# Patient Record
Sex: Male | Born: 1940 | Race: White | Hispanic: No | Marital: Married | Smoking: Never smoker
Health system: Southern US, Community
[De-identification: ages and names within clinical notes are randomized; demographics above are authoritative.]

## PROBLEM LIST (undated history)

## (undated) DIAGNOSIS — N189 Chronic kidney disease, unspecified: Secondary | ICD-10-CM

## (undated) DIAGNOSIS — T7840XA Allergy, unspecified, initial encounter: Secondary | ICD-10-CM

## (undated) DIAGNOSIS — E785 Hyperlipidemia, unspecified: Secondary | ICD-10-CM

## (undated) DIAGNOSIS — C4490 Unspecified malignant neoplasm of skin, unspecified: Secondary | ICD-10-CM

## (undated) DIAGNOSIS — I10 Essential (primary) hypertension: Secondary | ICD-10-CM

## (undated) HISTORY — DX: Hyperlipidemia, unspecified: E78.5

## (undated) HISTORY — PX: MOHS SURGERY: SUR867

## (undated) HISTORY — PX: COLONOSCOPY: SHX174

## (undated) HISTORY — DX: Essential (primary) hypertension: I10

## (undated) HISTORY — DX: Unspecified malignant neoplasm of skin, unspecified: C44.90

## (undated) HISTORY — PX: LITHOTRIPSY: SUR834

## (undated) HISTORY — PX: OTHER SURGICAL HISTORY: SHX169

## (undated) HISTORY — DX: Allergy, unspecified, initial encounter: T78.40XA

---

## 1997-07-26 ENCOUNTER — Other Ambulatory Visit: Admission: RE | Admit: 1997-07-26 | Discharge: 1997-07-26 | Payer: Self-pay | Admitting: Urology

## 2004-01-13 ENCOUNTER — Ambulatory Visit: Payer: Self-pay | Admitting: Internal Medicine

## 2004-06-01 ENCOUNTER — Ambulatory Visit: Payer: Self-pay | Admitting: Internal Medicine

## 2004-08-05 ENCOUNTER — Ambulatory Visit: Payer: Self-pay | Admitting: Internal Medicine

## 2004-10-09 ENCOUNTER — Ambulatory Visit: Payer: Self-pay | Admitting: Internal Medicine

## 2005-02-01 ENCOUNTER — Ambulatory Visit: Payer: Self-pay | Admitting: Internal Medicine

## 2005-07-27 ENCOUNTER — Ambulatory Visit: Payer: Self-pay | Admitting: Internal Medicine

## 2005-08-13 ENCOUNTER — Ambulatory Visit: Payer: Self-pay | Admitting: Internal Medicine

## 2005-09-14 ENCOUNTER — Ambulatory Visit: Payer: Self-pay | Admitting: Internal Medicine

## 2005-12-15 ENCOUNTER — Ambulatory Visit: Payer: Self-pay | Admitting: Gastroenterology

## 2005-12-31 ENCOUNTER — Ambulatory Visit: Payer: Self-pay | Admitting: Gastroenterology

## 2005-12-31 LAB — HM COLONOSCOPY

## 2006-02-22 ENCOUNTER — Ambulatory Visit: Payer: Self-pay | Admitting: Internal Medicine

## 2006-02-22 LAB — CONVERTED CEMR LAB
Albumin: 3.9 g/dL (ref 3.5–5.2)
Chol/HDL Ratio, serum: 4.1
Creatinine,U: 171.4 mg/dL
Hgb A1c MFr Bld: 5.5 % (ref 4.6–6.0)
Microalb, Ur: 0.7 mg/dL (ref 0.0–1.9)
Uric Acid, Serum: 6.6 mg/dL (ref 2.4–7.0)
VLDL: 14 mg/dL (ref 0–40)

## 2007-06-28 ENCOUNTER — Ambulatory Visit: Payer: Self-pay | Admitting: Internal Medicine

## 2007-06-28 DIAGNOSIS — Z87442 Personal history of urinary calculi: Secondary | ICD-10-CM

## 2007-06-28 DIAGNOSIS — E790 Hyperuricemia without signs of inflammatory arthritis and tophaceous disease: Secondary | ICD-10-CM | POA: Insufficient documentation

## 2007-06-28 DIAGNOSIS — N433 Hydrocele, unspecified: Secondary | ICD-10-CM

## 2007-06-28 DIAGNOSIS — E782 Mixed hyperlipidemia: Secondary | ICD-10-CM | POA: Insufficient documentation

## 2007-06-29 ENCOUNTER — Encounter (INDEPENDENT_AMBULATORY_CARE_PROVIDER_SITE_OTHER): Payer: Self-pay | Admitting: *Deleted

## 2007-06-29 ENCOUNTER — Encounter: Payer: Self-pay | Admitting: Internal Medicine

## 2007-06-30 ENCOUNTER — Encounter (INDEPENDENT_AMBULATORY_CARE_PROVIDER_SITE_OTHER): Payer: Self-pay | Admitting: *Deleted

## 2007-07-26 ENCOUNTER — Telehealth (INDEPENDENT_AMBULATORY_CARE_PROVIDER_SITE_OTHER): Payer: Self-pay | Admitting: *Deleted

## 2007-07-26 ENCOUNTER — Ambulatory Visit: Payer: Self-pay | Admitting: Internal Medicine

## 2007-07-26 DIAGNOSIS — I10 Essential (primary) hypertension: Secondary | ICD-10-CM

## 2007-07-26 DIAGNOSIS — T887XXA Unspecified adverse effect of drug or medicament, initial encounter: Secondary | ICD-10-CM | POA: Insufficient documentation

## 2008-08-14 ENCOUNTER — Encounter (INDEPENDENT_AMBULATORY_CARE_PROVIDER_SITE_OTHER): Payer: Self-pay | Admitting: *Deleted

## 2008-08-27 ENCOUNTER — Ambulatory Visit: Payer: Self-pay | Admitting: Internal Medicine

## 2008-08-27 DIAGNOSIS — K5730 Diverticulosis of large intestine without perforation or abscess without bleeding: Secondary | ICD-10-CM

## 2008-08-27 DIAGNOSIS — Z85828 Personal history of other malignant neoplasm of skin: Secondary | ICD-10-CM

## 2008-08-27 LAB — CONVERTED CEMR LAB: LDL Goal: 90 mg/dL

## 2008-08-28 ENCOUNTER — Encounter: Payer: Self-pay | Admitting: Internal Medicine

## 2008-08-29 ENCOUNTER — Ambulatory Visit: Payer: Self-pay | Admitting: Internal Medicine

## 2008-09-13 ENCOUNTER — Encounter (INDEPENDENT_AMBULATORY_CARE_PROVIDER_SITE_OTHER): Payer: Self-pay | Admitting: *Deleted

## 2008-09-17 ENCOUNTER — Telehealth (INDEPENDENT_AMBULATORY_CARE_PROVIDER_SITE_OTHER): Payer: Self-pay | Admitting: *Deleted

## 2008-11-27 ENCOUNTER — Ambulatory Visit: Payer: Self-pay | Admitting: Internal Medicine

## 2008-12-02 ENCOUNTER — Encounter (INDEPENDENT_AMBULATORY_CARE_PROVIDER_SITE_OTHER): Payer: Self-pay | Admitting: *Deleted

## 2008-12-02 LAB — CONVERTED CEMR LAB
ALT: 23 units/L (ref 0–53)
AST: 24 units/L (ref 0–37)
Albumin: 3.9 g/dL (ref 3.5–5.2)
Alkaline Phosphatase: 56 units/L (ref 39–117)
Bilirubin, Direct: 0 mg/dL (ref 0.0–0.3)
Cholesterol: 163 mg/dL (ref 0–200)
Total Protein: 6.7 g/dL (ref 6.0–8.3)
Triglycerides: 56 mg/dL (ref 0.0–149.0)

## 2008-12-12 ENCOUNTER — Ambulatory Visit: Payer: Self-pay | Admitting: Internal Medicine

## 2009-06-06 ENCOUNTER — Ambulatory Visit: Payer: Self-pay | Admitting: Internal Medicine

## 2009-06-17 LAB — CONVERTED CEMR LAB
HDL: 57.9 mg/dL (ref >39.00)
Total CHOL/HDL Ratio: 3

## 2009-12-01 ENCOUNTER — Encounter: Payer: Self-pay | Admitting: Internal Medicine

## 2010-01-06 ENCOUNTER — Ambulatory Visit: Payer: Self-pay | Admitting: Internal Medicine

## 2010-01-06 ENCOUNTER — Encounter: Payer: Self-pay | Admitting: Internal Medicine

## 2010-01-06 DIAGNOSIS — R319 Hematuria, unspecified: Secondary | ICD-10-CM

## 2010-01-12 LAB — CONVERTED CEMR LAB
AST: 32 units/L (ref 0–37)
Albumin: 4.3 g/dL (ref 3.5–5.2)
Alkaline Phosphatase: 55 units/L (ref 39–117)
Basophils Relative: 0.3 % (ref 0.0–3.0)
CO2: 31 meq/L (ref 19–32)
Calcium: 10.1 mg/dL (ref 8.4–10.5)
Chloride: 107 meq/L (ref 96–112)
Eosinophils Absolute: 0.2 10*3/uL (ref 0.0–0.7)
Glucose, Bld: 103 mg/dL — ABNORMAL HIGH (ref 70–99)
HCT: 44.5 % (ref 39.0–52.0)
Hemoglobin: 15.4 g/dL (ref 13.0–17.0)
Lymphocytes Relative: 31.4 % (ref 12.0–46.0)
Lymphs Abs: 2 10*3/uL (ref 0.7–4.0)
MCHC: 34.6 g/dL (ref 30.0–36.0)
Neutro Abs: 3.7 10*3/uL (ref 1.4–7.7)
PSA: 1.19 ng/mL (ref 0.10–4.00)
Potassium: 4.5 meq/L (ref 3.5–5.1)
RBC: 4.57 M/uL (ref 4.22–5.81)
Sodium: 145 meq/L (ref 135–145)
TSH: 1.71 microintl units/mL (ref 0.35–5.50)
Total Protein: 6.7 g/dL (ref 6.0–8.3)

## 2010-04-05 LAB — CONVERTED CEMR LAB
ALT: 19 units/L (ref 0–53)
Albumin: 4 g/dL (ref 3.5–5.2)
BUN: 12 mg/dL (ref 6–23)
BUN: 14 mg/dL (ref 6–23)
Basophils Absolute: 0 10*3/uL (ref 0.0–0.1)
Basophils Relative: 0.1 % (ref 0.0–3.0)
Basophils Relative: 0.4 % (ref 0.0–1.0)
Bilirubin, Direct: 0 mg/dL (ref 0.0–0.3)
Calcium: 9.2 mg/dL (ref 8.4–10.5)
Calcium: 9.3 mg/dL (ref 8.4–10.5)
Chloride: 105 meq/L (ref 96–112)
Cholesterol: 178 mg/dL (ref 0–200)
Creatinine, Ser: 1 mg/dL (ref 0.4–1.5)
Creatinine, Ser: 1.2 mg/dL (ref 0.4–1.5)
Eosinophils Absolute: 0.3 10*3/uL (ref 0.0–0.7)
Eosinophils Relative: 5.1 % — ABNORMAL HIGH (ref 0.0–5.0)
Eosinophils Relative: 6.7 % — ABNORMAL HIGH (ref 0.0–5.0)
GFR calc Af Amer: 96 mL/min
GFR calc non Af Amer: 79 mL/min
HCT: 43.2 % (ref 39.0–52.0)
HCT: 44 % (ref 39.0–52.0)
HDL goal, serum: 40 mg/dL
Hemoglobin: 14.8 g/dL (ref 13.0–17.0)
Hgb A1c MFr Bld: 5.8 % (ref 4.6–6.0)
Hgb A1c MFr Bld: 5.9 % (ref 4.6–6.5)
LDL Goal: 160 mg/dL
MCHC: 33.7 g/dL (ref 30.0–36.0)
MCV: 94.2 fL (ref 78.0–100.0)
MCV: 95.4 fL (ref 78.0–100.0)
Monocytes Absolute: 0.5 10*3/uL (ref 0.1–1.0)
Monocytes Relative: 9.3 % (ref 3.0–12.0)
Neutro Abs: 2.9 10*3/uL (ref 1.4–7.7)
Neutrophils Relative %: 53.5 % (ref 43.0–77.0)
Neutrophils Relative %: 58.5 % (ref 43.0–77.0)
PSA: 0.67 ng/mL (ref 0.10–4.00)
Platelets: 168 10*3/uL (ref 150.0–400.0)
RBC: 4.59 M/uL (ref 4.22–5.81)
RBC: 4.61 M/uL (ref 4.22–5.81)
TSH: 1.88 microintl units/mL (ref 0.35–5.50)
TSH: 2.18 microintl units/mL (ref 0.35–5.50)
Total Bilirubin: 0.8 mg/dL (ref 0.3–1.2)
Total Bilirubin: 0.9 mg/dL (ref 0.3–1.2)
Total Protein: 7.2 g/dL (ref 6.0–8.3)
Uric Acid, Serum: 7.8 mg/dL (ref 4.0–7.8)
Uric Acid, Serum: 8.8 mg/dL — ABNORMAL HIGH (ref 4.0–7.8)
VLDL: 14 mg/dL (ref 0–40)
WBC: 5.5 10*3/uL (ref 4.5–10.5)
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
## 2010-04-09 NOTE — Letter (Signed)
Summary: [facility]  [facility]   Imported By: Lanelle Bal 12/12/2009 12:59:42  _____________________________________________________________________  External Attachment:    Type:   Image     Comment:   External Document

## 2010-04-09 NOTE — Letter (Signed)
Summary: Patient Questionnaire  Patient Questionnaire   Imported By: Lanelle Bal 03/27/2010 09:57:29  _____________________________________________________________________  External Attachment:    Type:   Image     Comment:   External Document

## 2010-11-18 ENCOUNTER — Ambulatory Visit (INDEPENDENT_AMBULATORY_CARE_PROVIDER_SITE_OTHER): Payer: Medicare Other | Admitting: Internal Medicine

## 2010-11-18 ENCOUNTER — Encounter: Payer: Self-pay | Admitting: Internal Medicine

## 2010-11-18 VITALS — BP 124/80 | HR 100 | Temp 98.1°F | Wt 203.6 lb

## 2010-11-18 DIAGNOSIS — J310 Chronic rhinitis: Secondary | ICD-10-CM

## 2010-11-18 MED ORDER — FLUTICASONE PROPIONATE 50 MCG/ACT NA SUSP: 1.0000 | NASAL | Status: DC

## 2010-11-18 NOTE — Progress Notes (Signed)
  Subjective:    Patient ID: Jeffrey Watkins, male    DOB: 1940-03-23, 70 y.o.   MRN: [id number]  HPI Respiratory tract infection Onset/symptoms:1 month ago as nasal drainage Exposures (illness/environmental/extrinsic):no; den being renovated this week Progression of symptoms:worse in past 3 days with increased secretions Treatments/response:Claritin with some benefit Present symptoms: Fever/chills/sweats:no Frontal headache:no Facial pain:no Nasal purulence:no Sore throat:yes Dental pain:no Lymphadenopathy:no Wheezing/shortness of breath:no Cough/sputum/hemoptysis:white phlegm Associated extrinsic/allergic symptoms:itchy eyes/ sneezing:yes Past medical history: Seasonal allergies/asthma:no Smoking history:never           Review of Systems     Objective:   Physical Exam General appearance is of good health and nourishment; no acute distress or increased work of breathing is present.  No  lymphadenopathy about the head, neck, or axilla noted.   Eyes: No conjunctival inflammation or lid edema is present.   Ears:  External ear exam shows no significant lesions or deformities.  Otoscopic examination reveals clear canals, tympanic membranes are intact bilaterally without bulging, retraction, inflammation or discharge.  Nose:  External nasal examination shows no deformity or inflammation. Nasal mucosa are dry & slightly erythematous without lesions or exudates. Minimal  septal deviation.No obstruction to airflow.   Oral exam: Dental hygiene is good; lips and gums are healthy appearing.There is no oropharyngeal erythema or exudate noted.      Heart:  Normal rate and regular rhythm. S1 and S2 normal without gallop, murmur, click, rub.S4 Lungs:Chest clear to auscultation; no wheezes, rhonchi,rales ,or rubs present.No increased work of breathing.    Extremities:  No cyanosis, edema, or clubbing  noted    Skin: Warm & dry          Assessment & Plan:  #1 rhinitis without  criteria for rhinosinusitis. Environmental exposures recently probably have  exacerbated symptoms.  Plan: See orders and recommendations.

## 2010-11-18 NOTE — Patient Instructions (Addendum)
Plain Mucinex for thick secretions ;force NON dairy fluids for next 48 hrs. Use a Neti pot daily as needed for sinus congestion .Flonase 1 spray in each nostril twice a day as needed. Use the "crossover" technique as discussed  Use Allegra 180 mg daily as needed

## 2011-01-05 ENCOUNTER — Telehealth: Payer: Self-pay | Admitting: Internal Medicine

## 2011-01-05 NOTE — Telephone Encounter (Signed)
Discuss with patient will call back to schedule OV.

## 2011-01-05 NOTE — Telephone Encounter (Signed)
Losartan and Diovan are in the same class of drugs, angiotensin receptor blockers. Neither of these cause cough,but the ACE inhibitor agents will cause cough. There must be some other cause of the cough rather than Losartan. I recommend an office visit prior if symptoms persist. I doubt insurance will pay for the branded as  this generic ARB is available.

## 2011-02-04 ENCOUNTER — Other Ambulatory Visit: Payer: Self-pay | Admitting: Internal Medicine

## 2011-03-24 ENCOUNTER — Encounter: Payer: Self-pay | Admitting: Internal Medicine

## 2011-03-24 ENCOUNTER — Ambulatory Visit (INDEPENDENT_AMBULATORY_CARE_PROVIDER_SITE_OTHER): Payer: Medicare Other | Admitting: Internal Medicine

## 2011-03-24 VITALS — BP 140/82 | HR 91 | Temp 98.2°F | Resp 12 | Ht 70.5 in | Wt 196.2 lb

## 2011-03-24 DIAGNOSIS — Z23 Encounter for immunization: Secondary | ICD-10-CM

## 2011-03-24 DIAGNOSIS — K5730 Diverticulosis of large intestine without perforation or abscess without bleeding: Secondary | ICD-10-CM

## 2011-03-24 DIAGNOSIS — Z87448 Personal history of other diseases of urinary system: Secondary | ICD-10-CM

## 2011-03-24 DIAGNOSIS — Z0000 Encounter for general adult medical examination without abnormal findings: Secondary | ICD-10-CM

## 2011-03-24 DIAGNOSIS — T887XXA Unspecified adverse effect of drug or medicament, initial encounter: Secondary | ICD-10-CM

## 2011-03-24 DIAGNOSIS — E785 Hyperlipidemia, unspecified: Secondary | ICD-10-CM

## 2011-03-24 DIAGNOSIS — R9431 Abnormal electrocardiogram [ECG] [EKG]: Secondary | ICD-10-CM | POA: Insufficient documentation

## 2011-03-24 DIAGNOSIS — I10 Essential (primary) hypertension: Secondary | ICD-10-CM

## 2011-03-24 DIAGNOSIS — Z85828 Personal history of other malignant neoplasm of skin: Secondary | ICD-10-CM

## 2011-03-24 LAB — CBC WITH DIFFERENTIAL/PLATELET
Basophils Relative: 0.2 % (ref 0.0–3.0)
Eosinophils Absolute: 0.1 10*3/uL (ref 0.0–0.7)
Eosinophils Relative: 1.8 % (ref 0.0–5.0)
Hemoglobin: 14.8 g/dL (ref 13.0–17.0)
Lymphocytes Relative: 26.3 % (ref 12.0–46.0)
MCHC: 34.3 g/dL (ref 30.0–36.0)
MCV: 95.7 fl (ref 78.0–100.0)
Neutro Abs: 4.4 10*3/uL (ref 1.4–7.7)
Neutrophils Relative %: 63.7 % (ref 43.0–77.0)
RBC: 4.5 Mil/uL (ref 4.22–5.81)
WBC: 6.9 10*3/uL (ref 4.5–10.5)

## 2011-03-24 LAB — URIC ACID: Uric Acid, Serum: 6.6 mg/dL (ref 4.0–7.8)

## 2011-03-24 LAB — HEPATIC FUNCTION PANEL
ALT: 19 U/L (ref 0–53)
Albumin: 4.4 g/dL (ref 3.5–5.2)
Alkaline Phosphatase: 45 U/L (ref 39–117)
Bilirubin, Direct: 0.1 mg/dL (ref 0.0–0.3)
Total Protein: 7 g/dL (ref 6.0–8.3)

## 2011-03-24 LAB — BASIC METABOLIC PANEL
CO2: 30 mEq/L (ref 19–32)
Calcium: 9.6 mg/dL (ref 8.4–10.5)
Chloride: 104 mEq/L (ref 96–112)
Sodium: 143 mEq/L (ref 135–145)

## 2011-03-24 LAB — LIPID PANEL: HDL: 48.9 mg/dL (ref >39.00)

## 2011-03-24 LAB — LDL CHOLESTEROL, DIRECT: Direct LDL: 160.8 mg/dL
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
## 2011-03-24 NOTE — Patient Instructions (Signed)
Preventive Health Care: Exercise at least 30-45 minutes a day,  3-4 days a week.  Eat a low-fat diet with lots of fruits and vegetables, up to 7-9 servings per day. Consume less than 40 grams of sugar per day from foods & drinks with High Fructose Corn Sugar as # 1,2,3 or # 4 on label. Health Care Power of Attorney & Living Will. Complete if not in place ; these place you in charge of your health care decisions. Blood Pressure Goal  Ideally is an AVERAGE < 135/85. This AVERAGE should be calculated from @ least 5-7 BP readings taken @ different times of day on different days of week. You should not respond to isolated BP readings , but rather the AVERAGE for that week  

## 2011-03-25 LAB — TSH: TSH: 1.91 u[IU]/mL (ref 0.35–5.50)

## 2011-11-02 ENCOUNTER — Other Ambulatory Visit: Payer: Self-pay | Admitting: Internal Medicine

## 2012-04-17 ENCOUNTER — Encounter: Payer: Self-pay | Admitting: Internal Medicine

## 2012-04-17 ENCOUNTER — Ambulatory Visit (INDEPENDENT_AMBULATORY_CARE_PROVIDER_SITE_OTHER): Payer: Medicare Other | Admitting: Internal Medicine

## 2012-04-17 VITALS — BP 132/84 | HR 81 | Temp 98.2°F | Resp 14 | Ht 70.5 in | Wt 197.0 lb

## 2012-04-17 DIAGNOSIS — Z0000 Encounter for general adult medical examination without abnormal findings: Secondary | ICD-10-CM

## 2012-04-17 DIAGNOSIS — I10 Essential (primary) hypertension: Secondary | ICD-10-CM

## 2012-04-17 DIAGNOSIS — Z87448 Personal history of other diseases of urinary system: Secondary | ICD-10-CM

## 2012-04-17 DIAGNOSIS — Z87442 Personal history of urinary calculi: Secondary | ICD-10-CM

## 2012-04-17 DIAGNOSIS — J019 Acute sinusitis, unspecified: Secondary | ICD-10-CM

## 2012-04-17 DIAGNOSIS — K5730 Diverticulosis of large intestine without perforation or abscess without bleeding: Secondary | ICD-10-CM

## 2012-04-17 DIAGNOSIS — E785 Hyperlipidemia, unspecified: Secondary | ICD-10-CM

## 2012-04-17 LAB — BASIC METABOLIC PANEL
BUN: 14 mg/dL (ref 6–23)
Chloride: 100 mEq/L (ref 96–112)
GFR: 62.13 mL/min (ref >60.00)
Glucose, Bld: 107 mg/dL — ABNORMAL HIGH (ref 70–99)
Potassium: 4.1 mEq/L (ref 3.5–5.1)
Sodium: 138 mEq/L (ref 135–145)

## 2012-04-17 LAB — HEPATIC FUNCTION PANEL
Alkaline Phosphatase: 45 U/L (ref 39–117)
Bilirubin, Direct: 0.1 mg/dL (ref 0.0–0.3)

## 2012-04-17 LAB — CBC WITH DIFFERENTIAL/PLATELET
Basophils Absolute: 0 10*3/uL (ref 0.0–0.1)
Basophils Relative: 0.2 % (ref 0.0–3.0)
Eosinophils Absolute: 0.1 10*3/uL (ref 0.0–0.7)
HCT: 44.9 % (ref 39.0–52.0)
Hemoglobin: 15.3 g/dL (ref 13.0–17.0)
Lymphocytes Relative: 26.4 % (ref 12.0–46.0)
MCV: 93.2 fl (ref 78.0–100.0)
Neutrophils Relative %: 63.3 % (ref 43.0–77.0)
Platelets: 224 10*3/uL (ref 150.0–400.0)
WBC: 6.8 10*3/uL (ref 4.5–10.5)

## 2012-04-17 LAB — LIPID PANEL
HDL: 46.7 mg/dL (ref >39.00)
VLDL: 20.4 mg/dL (ref 0.0–40.0)

## 2012-04-17 LAB — TSH: TSH: 1.55 u[IU]/mL (ref 0.35–5.50)

## 2012-04-17 MED ORDER — FLUTICASONE PROPIONATE 50 MCG/ACT NA SUSP: 1.0000 | Freq: Two times a day (BID) | NASAL | Status: DC | PRN

## 2012-04-17 MED ORDER — AMOXICILLIN 500 MG PO CAPS: 500.0000 mg | ORAL_CAPSULE | Freq: Three times a day (TID) | ORAL | Status: DC

## 2012-04-17 MED ORDER — LOSARTAN POTASSIUM 50 MG PO TABS: 50.0000 mg | ORAL_TABLET | Freq: Every day | ORAL | Status: DC

## 2012-04-17 NOTE — Patient Instructions (Addendum)
Preventive Health Care: Exercise at least 30-45 minutes a day,  3-4 days a week.  Eat a low-fat diet with lots of fruits and vegetables, up to 7-9 servings per day. This would eliminate the need for vitamin supplements. Consume less than 40 grams of sugar (preferably ZERO) per day from foods & drinks with High Fructose Corn Sugar as #1,2,3 or # 4 on label. Health Care Power of Attorney & Living Will. Complete these if not in place ; these place you in charge of your health care decisions.  Plain Mucinex (NOT D) for thick secretions ;force NON dairy fluids .   Nasal cleansing in the shower as discussed with lather of mild shampoo.After 10 seconds wash off lather while  exhaling through nostrils. Make sure that all residual soap is removed to prevent irritation.  Fluticasone 1 spray in each nostril twice a day as needed. Use the "crossover" technique into opposite nostril spraying toward opposite ear @ 45 degree angle, not straight up into nostril.  Use a Neti pot daily only  as needed for significant sinus congestion; going from open side to congested side . Plain Allegra (NOT D )  160 daily , Loratidine 10 mg , OR Zyrtec 10 mg @ bedtime  as needed for itchy eyes & sneezing.

## 2012-04-17 NOTE — Progress Notes (Signed)
Subjective:    Patient ID: Jeffrey Watkins, male    DOB: 1940-09-03, 72 y.o.   MRN: [id number]  HPI Medicare Wellness Visit:  Psychosocial & medical history were reviewed as required by Medicare (abuse,antisocial behavioral risks,firearm risk).  Social history: caffeine:1.5 cups / day coffee  , alcohol: rarely  ,  tobacco use:   never Exercise :  See below No home & personal  safety / fall risk Activities of daily living: no limitations  Seatbelt  and smoke alarm employed. Power of Attorney/Living Will status : needed Ophthalmology exam current Hearing evaluation not current Orientation :oriented X 3  Memory & recall :good Math testing:good Mood & affect : normal . Depression / anxiety: denied Travel history : last in [place]  2008 Immunization status : Shingles needed Transfusion history:  none  Preventive health surveillance ( colonoscopies, BMD , mammograms,PAP as per protocol/ [facility]): current  Dental care:  Every 6 mos. Chart reviewed &  Updated. Active issues reviewed & addressed.      Review of Systems He is on a heart healthy diet; he exercises 35- 60 minutes 5 times per week without symptoms. Specifically he denies chest pain, palpitations, dyspnea, or claudication. Family history is negative for premature coronary disease. Advanced cholesterol testing reveals his LDL goal was less than 100. Respiratory tract symptoms began 1 month ago as rhinitis  & head congestion w/o chest congestion or cough . Exposures to sick son. Significant active  associated symptoms include frontal & facial pressure;intermittent dental pain; & green nasal purulence . Cough is not associated with sputum production ; shortness of breath; or  wheezing . Fever chills sweats not present . Itchy  watery eyes sneezing were not noted. Flu shot NOT current        Treatment with Delsym  NSAIDS Tylenol Mucinex Robitussin Alka Seltzer Plus decongestants Nyquil was partially effective   There is no  history of asthma  seasonal perennial allergies. The patient had never smoked quit smoking in                Objective:   Physical Exam Gen.: Healthy and well-nourished in appearance. Alert, appropriate and cooperative throughout exam.  Head: Normocephalic without obvious abnormalities; pattern alopecia  Eyes: No corneal or conjunctival inflammation noted. Pupils equal round reactive to light and accommodation. Fundal exam is benign without hemorrhages, exudate, papilledema. Extraocular motion intact. Vision grossly normal. Ears: External  ear exam reveals no significant lesions or deformities. Canals clear .TMs normal. Hearing is grossly normal bilaterally. Nose: External nasal exam reveals no deformity or inflammation. Nasal mucosa are pink and moist. No lesions or exudates noted.   Mouth: Oral mucosa and oropharynx reveal no lesions or exudates. Teeth in good repair. Neck: No deformities, masses, or tenderness noted. Range of motion & Thyroid normal. Lungs: Normal respiratory effort; chest expands symmetrically. Lungs are clear to auscultation without rales, wheezes, or increased work of breathing. Heart: Normal rate and rhythm. Normal S1 and S2. No gallop, click, or rub. Grade 1/6 systolic murmur Abdomen: Bowel sounds normal; abdomen soft and nontender. No masses, organomegaly or hernias noted. Genitalia: Genitalia normal except for large R hydrocoele.. Prostate is normal without enlargement, asymmetry, nodularity, or induration.                Musculoskeletal/extremities: No deformity or scoliosis noted of  the thoracic or lumbar spine. No clubbing, cyanosis, edema, or significant extremity  deformity noted. Range of motion normal .Tone & strength  normal.Joints normal except for minor  5th R  DIP flexion changs. Nail health good. Able to lie down & sit up w/o help. Negative SLR bilaterally Vascular: Carotid, radial artery, dorsalis pedis and  posterior tibial pulses are full and equal.  No bruits present. Neurologic: Alert and oriented x3. Deep tendon reflexes symmetrical and normal.  Skin: Intact without suspicious lesions or rashes. Lymph: No cervical, axillary, or inguinal lymphadenopathy present. Psych: Mood and affect are normal. Normally interactive                                                                                        Assessment & Plan:  #1 Medicare Wellness Exam; criteria met ; data entered #2 Problem List reviewed ; Assessment/ Recommendations made Plan: see Orders

## 2012-04-24 ENCOUNTER — Ambulatory Visit: Payer: Medicare Other

## 2012-04-24 ENCOUNTER — Encounter: Payer: Self-pay | Admitting: *Deleted

## 2012-04-24 DIAGNOSIS — R7309 Other abnormal glucose: Secondary | ICD-10-CM

## 2012-04-25 LAB — HEMOGLOBIN A1C: Hgb A1c MFr Bld: 5.8 % (ref 4.6–6.5)

## 2012-05-29 ENCOUNTER — Ambulatory Visit (INDEPENDENT_AMBULATORY_CARE_PROVIDER_SITE_OTHER): Payer: Medicare Other | Admitting: Internal Medicine

## 2012-05-29 ENCOUNTER — Encounter: Payer: Self-pay | Admitting: Internal Medicine

## 2012-05-29 VITALS — BP 150/92 | HR 105 | Temp 98.3°F | Wt 194.0 lb

## 2012-05-29 DIAGNOSIS — K4090 Unilateral inguinal hernia, without obstruction or gangrene, not specified as recurrent: Secondary | ICD-10-CM

## 2012-05-29 NOTE — Patient Instructions (Addendum)

## 2012-05-29 NOTE — Progress Notes (Signed)
  Subjective:    Patient ID: Jeffrey Watkins, male    DOB: 1940/08/29, 72 y.o.   MRN: [id number]  HPI   Symptoms began 1-1.5 years ago as a slight tenderness in the left inguinal area without specific trigger or injury.  2 weeks ago he was backpacking in the [place] carrying a 50 pound backpack for up to 5 miles. After this he began to experience aching in that area.  Since he  returned the area is a bulge which appears during the day & resolves overnight only to recur the next day.   Review of Systems  He denies associated fever, chills, sweats, hematuria, pyuria, or dysuria . He also denies weight loss, change in stool, melena, rectal bleeding.    Objective:   Physical Exam  He appears healthy and well-nourished and in no distress  Abdomen reveals no organomegaly or masses.  A large hydrocele is present in the right scrotum.  There is a direct hernia on the left which is reducible fully but recurs with cough or strain         Assessment & Plan:   #1 direct inguinal hernia without incarceration  Plan: See orders and recommendations

## 2012-06-05 ENCOUNTER — Ambulatory Visit (INDEPENDENT_AMBULATORY_CARE_PROVIDER_SITE_OTHER): Payer: Medicare Other | Admitting: General Surgery

## 2012-06-05 ENCOUNTER — Encounter (INDEPENDENT_AMBULATORY_CARE_PROVIDER_SITE_OTHER): Payer: Self-pay | Admitting: General Surgery

## 2012-06-05 VITALS — BP 142/64 | HR 84 | Temp 98.5°F | Resp 16 | Ht 71.0 in | Wt 196.0 lb

## 2012-06-05 DIAGNOSIS — K4090 Unilateral inguinal hernia, without obstruction or gangrene, not specified as recurrent: Secondary | ICD-10-CM | POA: Insufficient documentation

## 2012-06-05 NOTE — Patient Instructions (Signed)
You have a medium sized left internal hernia. This is reducible. This is the cause of her pain. I do not feel a hernia on the right side.  He will be scheduled for laparoscopic repair of your left inguinal hernia with mesh, possible open repair.          Inguinal Hernia, Adult Muscles help keep everything in the body in its proper place. But if a weak spot in the muscles develops, something can poke through. That is called a hernia. When this happens in the lower part of the belly (abdomen), it is called an inguinal hernia. (It takes its name from a part of the body in this region called the inguinal canal.) A weak spot in the wall of muscles lets some fat or part of the small intestine bulge through. An inguinal hernia can develop at any age. Men get them more often than women. CAUSES  In adults, an inguinal hernia develops over time.  It can be triggered by:  Suddenly straining the muscles of the lower abdomen.  Lifting heavy objects.  Straining to have a bowel movement. Difficult bowel movements (constipation) can lead to this.  Constant coughing. This may be caused by smoking or lung disease.  Being overweight.  Being pregnant.  Working at a job that requires long periods of standing or heavy lifting.  Having had an inguinal hernia before. One type can be an emergency situation. It is called a strangulated inguinal hernia. It develops if part of the small intestine slips through the weak spot and cannot get back into the abdomen. The blood supply can be cut off. If that happens, part of the intestine may die. This situation requires emergency surgery. SYMPTOMS  Often, a small inguinal hernia has no symptoms. It is found when a healthcare provider does a physical exam. Larger hernias usually have symptoms.   In adults, symptoms may include:  A lump in the groin. This is easier to see when the person is standing. It might disappear when lying down.  In men, a lump in the  scrotum.  Pain or burning in the groin. This occurs especially when lifting, straining or coughing.  A dull ache or feeling of pressure in the groin.  Signs of a strangulated hernia can include:  A bulge in the groin that becomes very painful and tender to the touch.  A bulge that turns red or purple.  Fever, nausea and vomiting.  Inability to have a bowel movement or to pass gas. DIAGNOSIS  To decide if you have an inguinal hernia, a healthcare provider will probably do a physical examination.  This will include asking questions about any symptoms you have noticed.  The healthcare provider might feel the groin area and ask you to cough. If an inguinal hernia is felt, the healthcare provider may try to slide it back into the abdomen.  Usually no other tests are needed. TREATMENT  Treatments can vary. The size of the hernia makes a difference. Options include:  Watchful waiting. This is often suggested if the hernia is small and you have had no symptoms.  No medical procedure will be done unless symptoms develop.  You will need to watch closely for symptoms. If any occur, contact your healthcare provider right away.  Surgery. This is used if the hernia is larger or you have symptoms.  Open surgery. This is usually an outpatient procedure (you will not stay overnight in a hospital). An cut (incision) is made through the skin  in the groin. The hernia is put back inside the abdomen. The weak area in the muscles is then repaired by herniorrhaphy or hernioplasty. Herniorrhaphy: in this type of surgery, the weak muscles are sewn back together. Hernioplasty: a patch or mesh is used to close the weak area in the abdominal wall.  Laparoscopy. In this procedure, a surgeon makes small incisions. A thin tube with a tiny video camera (called a laparoscope) is put into the abdomen. The surgeon repairs the hernia with mesh by looking with the video camera and using two long instruments. HOME  CARE INSTRUCTIONS   After surgery to repair an inguinal hernia:  You will need to take pain medicine prescribed by your healthcare provider. Follow all directions carefully.  You will need to take care of the wound from the incision.  Your activity will be restricted for awhile. This will probably include no heavy lifting for several weeks. You also should not do anything too active for a few weeks. When you can return to work will depend on the type of job that you have.  During "watchful waiting" periods, you should:  Maintain a healthy weight.  Eat a diet high in fiber (fruits, vegetables and whole grains).  Drink plenty of fluids to avoid constipation. This means drinking enough water and other liquids to keep your urine clear or pale yellow.  Do not lift heavy objects.  Do not stand for long periods of time.  Quit smoking. This should keep you from developing a frequent cough. SEEK MEDICAL CARE IF:   A bulge develops in your groin area.  You feel pain, a burning sensation or pressure in the groin. This might be worse if you are lifting or straining.  You develop a fever of more than 100.5 F (38.1 C). SEEK IMMEDIATE MEDICAL CARE IF:   Pain in the groin increases suddenly.  A bulge in the groin gets bigger suddenly and does not go down.  For men, there is sudden pain in the scrotum. Or, the size of the scrotum increases.  A bulge in the groin area becomes red or purple and is painful to touch.  You have nausea or vomiting that does not go away.  You feel your heart beating much faster than normal.  You cannot have a bowel movement or pass gas.  You develop a fever of more than 102.0 F (38.9 C). Document Released: 07/11/2008 Document Revised: 05/17/2011 Document Reviewed: 07/11/2008 [facility] Patient Information 2013 [place], [place].        Hernia Repair with Laparoscope A hernia occurs when an internal organ pushes out through a weak spot in the belly  (abdominal) wall muscles. Hernias most commonly occur in the groin and around the navel. Hernias can also occur through a cut by the surgeon (incision) after an abdominal operation. A hernia may be caused by:  Lifting heavy objects.  Prolonged coughing.  Straining to move your bowels. Hernias can often be pushed back into place (reduced). Most hernias tend to get worse over time. Problems occur when abdominal contents get stuck in the opening and the blood supply is blocked or impaired (incarcerated hernia). Because of these risks, you require surgery to repair the hernia. Your hernia will be repaired using a laparoscope. Laparoscopic surgery is a type of minimally invasive surgery. It does not involve making a typical surgical cut (incision) in the skin. A laparoscope is a telescope-like rod and lens system. It is usually connected to a video camera and a light  source so your caregiver can clearly see the operative area. The instruments are inserted through  to  inch (5 mm or 10 mm) openings in the skin at specific locations. A working and viewing space is created by blowing a small amount of carbon dioxide gas into the abdominal cavity. The abdomen is essentially blown up like a balloon (insufflated). This elevates the abdominal wall above the internal organs like a dome. The carbon dioxide gas is common to the human body and can be absorbed by tissue and removed by the respiratory system. Once the repair is completed, the small incisions will be closed with either stitches (sutures) or staples (just like a paper stapler only this staple holds the skin together). LET YOUR CAREGIVERS KNOW ABOUT:  Allergies.  Medications taken including herbs, eye drops, over the counter medications, and creams.  Use of steroids (by mouth or creams).  Previous problems with anesthetics or Novocaine.  Possibility of pregnancy, if this applies.  History of blood clots (thrombophlebitis).  History of bleeding  or blood problems.  Previous surgery.  Other health problems. BEFORE THE PROCEDURE  Laparoscopy can be done either in a hospital or out-patient clinic. You may be given a mild sedative to help you relax before the procedure. Once in the operating room, you will be given a general anesthesia to make you sleep (unless you and your caregiver choose a different anesthetic).  AFTER THE PROCEDURE  After the procedure you will be watched in a recovery area. Depending on what type of hernia was repaired, you might be admitted to the hospital or you might go home the same day. With this procedure you may have less pain and scarring. This usually results in a quicker recovery and less risk of infection. HOME CARE INSTRUCTIONS   Bed rest is not required. You may continue your normal activities but avoid heavy lifting (more than 10 pounds) or straining.  Cough gently. If you are a smoker it is best to stop, as even the best hernia repair can break down with the continual strain of coughing.  Avoid driving until given the OK by your surgeon.  There are no dietary restrictions unless given otherwise.  TAKE ALL MEDICATIONS AS DIRECTED.  Only take over-the-counter or prescription medicines for pain, discomfort, or fever as directed by your caregiver. SEEK MEDICAL CARE IF:   There is increasing abdominal pain or pain in your incisions.  There is more bleeding from incisions, other than minimal spotting.  You feel light headed or faint.  You develop an unexplained fever, chills, and/or an oral temperature above 102 F (38.9 C).  You have redness, swelling, or increasing pain in the wound.  Pus coming from wound.  A foul smell coming from the wound or dressings. SEEK IMMEDIATE MEDICAL CARE IF:   You develop a rash.  You have difficulty breathing.  You have any allergic problems. MAKE SURE YOU:   Understand these instructions.  Will watch your condition.  Will get help right away if  you are not doing well or get worse. Document Released: 02/22/2005 Document Revised: 05/17/2011 Document Reviewed: 01/22/2009 [facility] Patient Information 2013 [place], [place].

## 2012-06-05 NOTE — Progress Notes (Signed)
Patient ID: Jeffrey Watkins, male   DOB: [birth year], 72 y.o.   MRN: [id number]  Chief Complaint  Patient presents with  . Inguinal Hernia    left, poss right    HPI Jeffrey Watkins is a 72 y.o. male.  He is referred by Dr. Marga Melnick for evaluation and management of a painful left inguinal hernia. I have operated on his wife for gallbladder disease in the past and she is here with him today.  The patient has never had a hernia problem in the past. He's had pain and a bulge in his left groin for about 1-1/2 years. This has been progressive. It clearly has interfered with his hobby of backpacking and  it is now interfering with your work. He says the bulge but generally goes back in. There's been no history consistent with incarceration or obstruction.  Comorbidities include hypertension, hyperlipidemia, kidneys stones and skin cancers but no melanoma.  HPI  Past Medical History  Diagnosis Date  . Hyperlipidemia   . HTN (hypertension)   . Hydrocele     R scrotum    Past Surgical History  Procedure Laterality Date  . Lithotripsy      X 1  . Colonoscopy      neg; Dr Arlyce Dice    Family History  Problem Relation Age of Onset  . Heart attack Mother     MI > 17  . Heart attack Father 44  . Aneurysm Brother 66    AAA  . Cancer Neg Hx   . Diabetes Neg Hx   . Stroke Neg Hx     Social History History  Substance Use Topics  . Smoking status: Never Smoker   . Smokeless tobacco: Not on file  . Alcohol Use: Yes     Comment:  rarely    Allergies  Allergen Reactions  . Codeine     REACTION: VOMITTING  . Simvastatin     REACTION: MUSCLE ACHES    Current Outpatient Prescriptions  Medication Sig Dispense Refill  . AMBULATORY NON FORMULARY MEDICATION Amyloid Clear: 3 caps daily       . AMBULATORY NON FORMULARY MEDICATION L-Arginine 2,100: 1 by mouth daily       . AMBULATORY NON FORMULARY MEDICATION Reversaltrol 200 mg: 1 by mouth daily       . Ascorbic Acid (VITAMIN C) 1000  MG tablet Take 1,000 mg by mouth daily.        Marland Kitchen aspirin 81 MG tablet Take 81 mg by mouth daily.        Marland Kitchen b complex vitamins tablet Take 1 tablet by mouth daily.        . Calcium-Magnesium-Vitamin D (CALCIUM MAGNESIUM PO) Take by mouth daily.        . Cholecalciferol (VITAMIN D) 2000 UNITS CAPS Take by mouth daily.        Marland Kitchen CINNAMON PO Take 2,000 mg by mouth daily.      . Coenzyme Q10 (COQ10 PO) Take by mouth daily.        Marland Kitchen GRAPE SEED EXTRACT PO Take 300 mg by mouth daily.        Marland Kitchen losartan (COZAAR) 50 MG tablet Take 1 tablet (50 mg total) by mouth daily.  90 tablet  3  . LYCOPENE PO Take by mouth daily.        . Melatonin 1 MG TABS Take 3 mg by mouth. 1 by mouth daily      . Multiple Vitamin (MULTIVITAMIN) tablet  Take 1 tablet by mouth daily.        Marland Kitchen NIACIN PO Take 1,500 mg by mouth daily.        . Omega-3 Fatty Acids (FISH OIL CONCENTRATE PO) Take by mouth daily.        Marland Kitchen PANTETHINE PO Take 600 mg by mouth daily.      Marland Kitchen PHYTOSTEROLS PO Take 450 mg by mouth daily.        . Probiotic Product (PROBIOTIC FORMULA PO) Take by mouth daily.        Marland Kitchen Specialty Vitamins Products (PROSTATE PO) Take by mouth. 3 by mouth daily       . TOCOPHEROLS-TOCOTRIENOLS PO Take by mouth daily.        . vitamin E 100 UNIT capsule Take 100 Units by mouth daily. With calcium - beta cal e antioxidant       No current facility-administered medications for this visit.    Review of Systems Review of Systems  Constitutional: Negative for fever, chills and unexpected weight change.  HENT: Negative for hearing loss, congestion, sore throat, trouble swallowing and voice change.   Eyes: Negative for visual disturbance.  Respiratory: Negative for cough and wheezing.   Cardiovascular: Negative for chest pain, palpitations and leg swelling.  Gastrointestinal: Negative for nausea, vomiting, abdominal pain, diarrhea, constipation, blood in stool, abdominal distention, anal bleeding and rectal pain.  Genitourinary:  Negative for hematuria and difficulty urinating.  Musculoskeletal: Negative for arthralgias.  Skin: Negative for rash and wound.  Neurological: Negative for seizures, syncope, weakness and headaches.  Hematological: Negative for adenopathy. Does not bruise/bleed easily.  Psychiatric/Behavioral: Negative for confusion.    Blood pressure 142/64, pulse 84, temperature 98.5 F (36.9 C), temperature source Temporal, resp. rate 16, height 5\' 11"  (1.803 m), weight 196 lb (88.905 kg).  Physical Exam Physical Exam  Constitutional: He is oriented to person, place, and time. He appears well-developed and well-nourished. No distress.  HENT:  Head: Normocephalic.  Nose: Nose normal.  Mouth/Throat: No oropharyngeal exudate.  Eyes: Conjunctivae and EOM are normal. Pupils are equal, round, and reactive to light. Right eye exhibits no discharge. Left eye exhibits no discharge. No scleral icterus.  Neck: Normal range of motion. Neck supple. No JVD present. No tracheal deviation present. No thyromegaly present.  Cardiovascular: Normal rate, regular rhythm, normal heart sounds and intact distal pulses.   No murmur heard. Pulmonary/Chest: Effort normal and breath sounds normal. No stridor. No respiratory distress. He has no wheezes. He has no rales. He exhibits no tenderness.  Abdominal: Soft. Bowel sounds are normal. He exhibits no distension and no mass. There is no tenderness. There is no rebound and no guarding.  Genitourinary:  Medium-sized left inguinal hernia, reducible when supine.  No evidence of inguinal hernia on the right. He does have a right scrotal hydrocele, which he states has been there for many years. No umbilical abnormality detected.  Musculoskeletal: Normal range of motion. He exhibits no edema and no tenderness.  Lymphadenopathy:    He has no cervical adenopathy.  Neurological: He is alert and oriented to person, place, and time. He has normal reflexes. Coordination normal.  Skin:  Skin is warm and dry. No rash noted. He is not diaphoretic. No erythema. No pallor.  Psychiatric: He has a normal mood and affect. His behavior is normal. Judgment and thought content normal.    Data Reviewed Dr. Frederik Pear office notes  Assessment    Symptomatic, reducible left inguinal hernia  Hypertension  Hyperlipidemia  History kidney stones  Right scrotal hydrocele, asymptomatic     Plan    We had a long talk about his hernia, the anatomy, different techniques for repair, healing issues, complications, temporary disabilities.  He would like to schedule laparoscopic repair of left inguinal hernia with mesh, possible open.  I discussed the indications, details, techniques, and numerous risks of the surgery with the patient and his wife. They understand all these issues and all of their questions were answered. They agree with this plan.        Angelia Mould. Derrell Lolling, M.D., [facility] General and Minimally invasive Surgery Breast and Colorectal Surgery Office:   [phone] Pager:   [phone]  06/05/2012, 5:27 PM

## 2012-06-23 ENCOUNTER — Encounter (HOSPITAL_COMMUNITY): Payer: Self-pay | Admitting: Pharmacy Technician

## 2012-07-05 ENCOUNTER — Encounter (HOSPITAL_COMMUNITY)
Admission: RE | Admit: 2012-07-05 | Discharge: 2012-07-05 | Disposition: A | Discharge status: Home / Self Care | Payer: Medicare Other | Source: Ambulatory Visit | Attending: General Surgery | Admitting: General Surgery

## 2012-07-05 ENCOUNTER — Ambulatory Visit (HOSPITAL_COMMUNITY)
Admission: RE | Admit: 2012-07-05 | Discharge: 2012-07-05 | Disposition: A | Discharge status: Home / Self Care | Payer: Medicare Other | Source: Ambulatory Visit | Attending: General Surgery | Admitting: General Surgery

## 2012-07-05 ENCOUNTER — Encounter (HOSPITAL_COMMUNITY): Payer: Self-pay

## 2012-07-05 DIAGNOSIS — K4090 Unilateral inguinal hernia, without obstruction or gangrene, not specified as recurrent: Secondary | ICD-10-CM | POA: Insufficient documentation

## 2012-07-05 DIAGNOSIS — Z01818 Encounter for other preprocedural examination: Secondary | ICD-10-CM | POA: Insufficient documentation

## 2012-07-05 DIAGNOSIS — I771 Stricture of artery: Secondary | ICD-10-CM | POA: Insufficient documentation

## 2012-07-05 DIAGNOSIS — I700 Atherosclerosis of aorta: Secondary | ICD-10-CM | POA: Insufficient documentation

## 2012-07-05 DIAGNOSIS — Z01812 Encounter for preprocedural laboratory examination: Secondary | ICD-10-CM | POA: Insufficient documentation

## 2012-07-05 DIAGNOSIS — I10 Essential (primary) hypertension: Secondary | ICD-10-CM | POA: Insufficient documentation

## 2012-07-05 LAB — CBC
HCT: 40.7 % (ref 39.0–52.0)
Hemoglobin: 14.4 g/dL (ref 13.0–17.0)
MCV: 89.1 fL (ref 78.0–100.0)
Platelets: 191 10*3/uL (ref 150–400)
RBC: 4.57 MIL/uL (ref 4.22–5.81)
WBC: 8 10*3/uL (ref 4.0–10.5)

## 2012-07-05 LAB — BASIC METABOLIC PANEL
BUN: 15 mg/dL (ref 6–23)
CO2: 29 mEq/L (ref 19–32)
Chloride: 100 mEq/L (ref 96–112)
Creatinine, Ser: 0.97 mg/dL (ref 0.50–1.35)

## 2012-07-05 NOTE — Patient Instructions (Signed)
20      Your procedure is scheduled on:  Tuesday 07/11/2012  Report to [facility] at   0530 AM.  Call this number if you have problems the morning of surgery: [phone]   Remember:             IF YOU USE CPAP,BRING MASK AND TUBING AM OF SURGERY!   Do not eat food or drink liquids AFTER MIDNIGHT!  Take these medicines the morning of surgery with A SIP OF WATER: NONE   Do not bring valuables to the hospital.  .  Leave suitcase in the car. After surgery it may be brought to your room.  For patients admitted to the hospital, checkout time is 11:00 AM the day of              Discharge.    DO NOT WEAR JEWELRY , MAKE-UP, LOTIONS,POWDERS,PERFUMES!             WOMEN -DO NOT SHAVE LEGS OR UNDERARMS 12 HRS. BEFORE SURGERY!               MEN MAY SHAVE AS USUAL!             CONTACTS,DENTURES OR BRIDGEWORK, FALSE EYELASHES MAY NOT BE WORN INTO SURGERY!                                           Patients discharged the day of surgery will not be allowed to drive home. If going home the same day of surgery, must have someone stay with you first 24 hrs.at home and arrange for someone to drive you home from the Hospital.                          YOUR DRIVER IS: Maxine-spouse   Special Instructions:             Please read over the following fact sheets that you were given:             1. [place] PREPARING FOR SURGERY SHEET              2.MRSA INFORMATION              3.INCENTIVE SPIROMETRY                                        Telford Nab.Marylan Glore,RN,BSN     [phone]                FAILURE TO FOLLOW THESE INSTRUCTIONS MAY RESULT IN   CANCELLATION OF YOUR SURGERY!               Patient Signature:___________________________

## 2012-07-10 NOTE — H&P (Signed)
Jeffrey Watkins   MRN:  [id number]   Description: 72 year old male  Provider: [name], MD  Department: [facility]       Diagnoses    Left inguinal hernia    -  Primary    550.90         Current Vitals - Last Recorded    BP Pulse Temp(Src) Resp Ht Wt    142/64 84 98.5 F (36.9 C) (Temporal) 16 5\' 11"  (1.803 m) 196 lb (88.905 kg)    BMI - 27.35 kg/m2                 History and Physical   [name], MD   Status: Signed                         HPI Jeffrey Watkins is a 72 y.o. male.  He is referred by Dr. Marga Melnick for evaluation and management of a painful left inguinal hernia. I have operated on his wife for gallbladder disease in the past and she is here with him today.   The patient has never had a hernia problem in the past. He's had pain and a bulge in his left groin for about 1-1/2 years. This has been progressive. It clearly has interfered with his hobby of backpacking and  it is now interfering with your work. He says the bulge but generally goes back in. There's been no history consistent with incarceration or obstruction.   Comorbidities include hypertension, hyperlipidemia, kidneys stones and skin cancers but no melanoma.        Past Medical History   Diagnosis  Date   .  Hyperlipidemia     .  HTN (hypertension)     .  Hydrocele         R scrotum         Past Surgical History   Procedure  Laterality  Date   .  Lithotripsy           X 1   .  Colonoscopy           neg; Dr Arlyce Dice         Family History   Problem  Relation  Age of Onset   .  Heart attack  Mother         MI > 39   .  Heart attack  Father  12   .  Aneurysm  Brother  66       AAA   .  Cancer  Neg Hx     .  Diabetes  Neg Hx     .  Stroke  Neg Hx          Social History History   Substance Use Topics   .  Smoking status:  Never Smoker    .  Smokeless tobacco:  Not on file   .  Alcohol Use:  Yes         Comment:  rarely          Allergies   Allergen  Reactions   .  Codeine         REACTION: VOMITTING   .  Simvastatin         REACTION: MUSCLE ACHES         Current Outpatient Prescriptions   Medication  Sig  Dispense  Refill   .  AMBULATORY NON FORMULARY MEDICATION  Amyloid  Clear: 3 caps daily          .  AMBULATORY NON FORMULARY MEDICATION  L-Arginine 2,100: 1 by mouth daily          .  AMBULATORY NON FORMULARY MEDICATION  Reversaltrol 200 mg: 1 by mouth daily          .  Ascorbic Acid (VITAMIN C) 1000 MG tablet  Take 1,000 mg by mouth daily.           Marland Kitchen  aspirin 81 MG tablet  Take 81 mg by mouth daily.           Marland Kitchen  b complex vitamins tablet  Take 1 tablet by mouth daily.           .  Calcium-Magnesium-Vitamin D (CALCIUM MAGNESIUM PO)  Take by mouth daily.           .  Cholecalciferol (VITAMIN D) 2000 UNITS CAPS  Take by mouth daily.           Marland Kitchen  CINNAMON PO  Take 2,000 mg by mouth daily.         .  Coenzyme Q10 (COQ10 PO)  Take by mouth daily.           Marland Kitchen  GRAPE SEED EXTRACT PO  Take 300 mg by mouth daily.           Marland Kitchen  losartan (COZAAR) 50 MG tablet  Take 1 tablet (50 mg total) by mouth daily.   90 tablet   3   .  LYCOPENE PO  Take by mouth daily.           .  Melatonin 1 MG TABS  Take 3 mg by mouth. 1 by mouth daily         .  Multiple Vitamin (MULTIVITAMIN) tablet  Take 1 tablet by mouth daily.           Marland Kitchen  NIACIN PO  Take 1,500 mg by mouth daily.           .  Omega-3 Fatty Acids (FISH OIL CONCENTRATE PO)  Take by mouth daily.           Marland Kitchen  PANTETHINE PO  Take 600 mg by mouth daily.         Marland Kitchen  PHYTOSTEROLS PO  Take 450 mg by mouth daily.           .  Probiotic Product (PROBIOTIC FORMULA PO)  Take by mouth daily.           Marland Kitchen  Specialty Vitamins Products (PROSTATE PO)  Take by mouth. 3 by mouth daily          .  TOCOPHEROLS-TOCOTRIENOLS PO  Take by mouth daily.           .  vitamin E 100 UNIT capsule  Take 100 Units by mouth daily. With calcium - beta cal e antioxidant             No current  facility-administered medications for this visit.        Review of Systems   Constitutional: Negative for fever, chills and unexpected weight change.  HENT: Negative for hearing loss, congestion, sore throat, trouble swallowing and voice change.   Eyes: Negative for visual disturbance.  Respiratory: Negative for cough and wheezing.   Cardiovascular: Negative for chest pain, palpitations and leg swelling.  Gastrointestinal: Negative for nausea, vomiting, abdominal pain, diarrhea, constipation, blood in stool, abdominal distention, anal bleeding and rectal  pain.  Genitourinary: Negative for hematuria and difficulty urinating.  Musculoskeletal: Negative for arthralgias.  Skin: Negative for rash and wound.  Neurological: Negative for seizures, syncope, weakness and headaches.  Hematological: Negative for adenopathy. Does not bruise/bleed easily.  Psychiatric/Behavioral: Negative for confusion.      Blood pressure 142/64, pulse 84, temperature 98.5 F (36.9 C), temperature source Temporal, resp. rate 16, height 5\' 11"  (1.803 m), weight 196 lb (88.905 kg).   Physical Exam   Constitutional: He is oriented to person, place, and time. He appears well-developed and well-nourished. No distress.  HENT:   Head: Normocephalic.   Nose: Nose normal.   Mouth/Throat: No oropharyngeal exudate.  Eyes: Conjunctivae and EOM are normal. Pupils are equal, round, and reactive to light. Right eye exhibits no discharge. Left eye exhibits no discharge. No scleral icterus.  Neck: Normal range of motion. Neck supple. No JVD present. No tracheal deviation present. No thyromegaly present.  Cardiovascular: Normal rate, regular rhythm, normal heart sounds and intact distal pulses.    No murmur heard. Pulmonary/Chest: Effort normal and breath sounds normal. No stridor. No respiratory distress. He has no wheezes. He has no rales. He exhibits no tenderness.  Abdominal: Soft. Bowel sounds are normal. He exhibits  no distension and no mass. There is no tenderness. There is no rebound and no guarding.  Genitourinary:  Medium-sized left inguinal hernia, reducible when supine.  No evidence of inguinal hernia on the right. He does have a right scrotal hydrocele, which he states has been there for many years. No umbilical abnormality detected.  Musculoskeletal: Normal range of motion. He exhibits no edema and no tenderness.  Lymphadenopathy:    He has no cervical adenopathy.  Neurological: He is alert and oriented to person, place, and time. He has normal reflexes. Coordination normal.  Skin: Skin is warm and dry. No rash noted. He is not diaphoretic. No erythema. No pallor.  Psychiatric: He has a normal mood and affect. His behavior is normal. Judgment and thought content normal.      Data Reviewed Dr. Frederik Pear office notes   Assessment    Symptomatic, reducible left inguinal hernia   Hypertension   Hyperlipidemia   History kidney stones   Right scrotal hydrocele, asymptomatic      Plan    We had a long talk about his hernia, the anatomy, different techniques for repair, healing issues, complications, temporary disabilities.   He would like to schedule laparoscopic repair of left inguinal hernia with mesh, possible open.   I discussed the indications, details, techniques, and numerous risks of the surgery with the patient and his wife. They understand all these issues and all of their questions were answered. They agree with this plan.           Angelia Mould. Derrell Lolling, M.D., [facility] General and Minimally invasive Surgery Breast and Colorectal Surgery Office:   [phone] Pager:   [phone]

## 2012-07-11 ENCOUNTER — Encounter (HOSPITAL_COMMUNITY)
Admission: RE | Disposition: A | Discharge status: Home / Self Care | Payer: Self-pay | Source: Ambulatory Visit | Attending: General Surgery

## 2012-07-11 ENCOUNTER — Encounter (HOSPITAL_COMMUNITY): Payer: Self-pay | Admitting: *Deleted

## 2012-07-11 ENCOUNTER — Ambulatory Visit (HOSPITAL_COMMUNITY): Payer: Medicare Other | Admitting: Anesthesiology

## 2012-07-11 ENCOUNTER — Ambulatory Visit (HOSPITAL_COMMUNITY)
Admission: RE | Admit: 2012-07-11 | Discharge: 2012-07-11 | Disposition: A | Discharge status: Home / Self Care | Payer: Medicare Other | Source: Ambulatory Visit | Attending: General Surgery | Admitting: General Surgery

## 2012-07-11 ENCOUNTER — Encounter (HOSPITAL_COMMUNITY): Payer: Self-pay | Admitting: Anesthesiology

## 2012-07-11 DIAGNOSIS — K4090 Unilateral inguinal hernia, without obstruction or gangrene, not specified as recurrent: Secondary | ICD-10-CM | POA: Diagnosis present

## 2012-07-11 DIAGNOSIS — R110 Nausea: Secondary | ICD-10-CM | POA: Insufficient documentation

## 2012-07-11 HISTORY — PX: INSERTION OF MESH: SHX5868

## 2012-07-11 HISTORY — PX: INGUINAL HERNIA REPAIR: SHX194

## 2012-07-11 SURGERY — REPAIR, HERNIA, INGUINAL, LAPAROSCOPIC
Anesthesia: General | Laterality: Left | Wound class: Clean

## 2012-07-11 MED ORDER — SODIUM CHLORIDE 0.9 % IV SOLN: INTRAVENOUS | Status: DC

## 2012-07-11 MED ORDER — LACTATED RINGERS IV SOLN
INTRAVENOUS | Status: DC
  Administered 2012-07-11: 1000 mL via INTRAVENOUS

## 2012-07-11 MED ORDER — ACETAMINOPHEN 10 MG/ML IV SOLN
INTRAVENOUS | Status: AC
  Filled 2012-07-11: qty 100

## 2012-07-11 MED ORDER — ACETAMINOPHEN 325 MG PO TABS: 650.0000 mg | ORAL_TABLET | ORAL | Status: DC | PRN

## 2012-07-11 MED ORDER — MIDAZOLAM HCL 5 MG/5ML IJ SOLN
INTRAMUSCULAR | Status: DC | PRN
  Administered 2012-07-11: 2 mg via INTRAVENOUS

## 2012-07-11 MED ORDER — SODIUM CHLORIDE 0.9 % IJ SOLN: 3.0000 mL | INTRAMUSCULAR | Status: DC | PRN

## 2012-07-11 MED ORDER — HYDROMORPHONE HCL PF 1 MG/ML IJ SOLN
INTRAMUSCULAR | Status: AC
  Filled 2012-07-11: qty 1

## 2012-07-11 MED ORDER — CEFAZOLIN SODIUM-DEXTROSE 2-3 GM-% IV SOLR
INTRAVENOUS | Status: AC
  Filled 2012-07-11: qty 50

## 2012-07-11 MED ORDER — LACTATED RINGERS IV SOLN
INTRAVENOUS | Status: DC | PRN
  Administered 2012-07-11: 07:00:00 via INTRAVENOUS

## 2012-07-11 MED ORDER — NEOSTIGMINE METHYLSULFATE 1 MG/ML IJ SOLN
INTRAMUSCULAR | Status: DC | PRN
  Administered 2012-07-11: 4 mg via INTRAVENOUS

## 2012-07-11 MED ORDER — FENTANYL CITRATE 0.05 MG/ML IJ SOLN
INTRAMUSCULAR | Status: DC | PRN
  Administered 2012-07-11: 50 ug via INTRAVENOUS
  Administered 2012-07-11 (×2): 100 ug via INTRAVENOUS

## 2012-07-11 MED ORDER — OXYCODONE HCL 5 MG PO TABS: 5.0000 mg | ORAL_TABLET | ORAL | Status: DC | PRN

## 2012-07-11 MED ORDER — SODIUM CHLORIDE 0.9 % IV SOLN: 250.0000 mL | INTRAVENOUS | Status: DC | PRN

## 2012-07-11 MED ORDER — ONDANSETRON HCL 4 MG/2ML IJ SOLN
INTRAMUSCULAR | Status: DC | PRN
  Administered 2012-07-11: 4 mg via INTRAVENOUS

## 2012-07-11 MED ORDER — BUPIVACAINE-EPINEPHRINE (PF) 0.5% -1:200000 IJ SOLN
INTRAMUSCULAR | Status: AC
  Filled 2012-07-11: qty 10

## 2012-07-11 MED ORDER — GLYCOPYRROLATE 0.2 MG/ML IJ SOLN
INTRAMUSCULAR | Status: DC | PRN
  Administered 2012-07-11: 0.6 mg via INTRAVENOUS

## 2012-07-11 MED ORDER — CEFAZOLIN SODIUM-DEXTROSE 2-3 GM-% IV SOLR
2.0000 g | INTRAVENOUS | Status: AC
  Administered 2012-07-11: 2 g via INTRAVENOUS

## 2012-07-11 MED ORDER — LIDOCAINE HCL (CARDIAC) 20 MG/ML IV SOLN
INTRAVENOUS | Status: DC | PRN
  Administered 2012-07-11: 50 mg via INTRAVENOUS

## 2012-07-11 MED ORDER — ACETAMINOPHEN 10 MG/ML IV SOLN
INTRAVENOUS | Status: DC | PRN
  Administered 2012-07-11: 1000 mg via INTRAVENOUS

## 2012-07-11 MED ORDER — ONDANSETRON HCL 4 MG/2ML IJ SOLN: 4.0000 mg | Freq: Four times a day (QID) | INTRAMUSCULAR | Status: DC | PRN

## 2012-07-11 MED ORDER — HYDROMORPHONE HCL PF 1 MG/ML IJ SOLN
0.2500 mg | INTRAMUSCULAR | Status: DC | PRN
  Administered 2012-07-11: 0.5 mg via INTRAVENOUS

## 2012-07-11 MED ORDER — PROMETHAZINE HCL 25 MG/ML IJ SOLN
6.2500 mg | INTRAMUSCULAR | Status: AC | PRN
  Administered 2012-07-11 (×2): 6.25 mg via INTRAVENOUS
  Filled 2012-07-11: qty 1

## 2012-07-11 MED ORDER — MORPHINE SULFATE 2 MG/ML IJ SOLN: 2.0000 mg | INTRAMUSCULAR | Status: DC | PRN

## 2012-07-11 MED ORDER — BUPIVACAINE-EPINEPHRINE 0.5% -1:200000 IJ SOLN
INTRAMUSCULAR | Status: DC | PRN
  Administered 2012-07-11: 13 mL

## 2012-07-11 MED ORDER — LACTATED RINGERS IV SOLN: INTRAVENOUS | Status: DC

## 2012-07-11 MED ORDER — ACETAMINOPHEN 650 MG RE SUPP
650.0000 mg | RECTAL | Status: DC | PRN
  Filled 2012-07-11: qty 1

## 2012-07-11 MED ORDER — SODIUM CHLORIDE 0.9 % IJ SOLN: 3.0000 mL | Freq: Two times a day (BID) | INTRAMUSCULAR | Status: DC

## 2012-07-11 MED ORDER — ROCURONIUM BROMIDE 100 MG/10ML IV SOLN
INTRAVENOUS | Status: DC | PRN
  Administered 2012-07-11: 45 mg via INTRAVENOUS

## 2012-07-11 MED ORDER — KETOROLAC TROMETHAMINE 30 MG/ML IJ SOLN
INTRAMUSCULAR | Status: DC | PRN
  Administered 2012-07-11: 15 mg via INTRAVENOUS

## 2012-07-11 MED ORDER — PROPOFOL 10 MG/ML IV BOLUS
INTRAVENOUS | Status: DC | PRN
  Administered 2012-07-11: 180 mg via INTRAVENOUS

## 2012-07-11 SURGICAL SUPPLY — 34 items
APPLIER CLIP LOGIC TI 5 (MISCELLANEOUS) IMPLANT
BENZOIN TINCTURE PRP APPL 2/3 (GAUZE/BANDAGES/DRESSINGS) IMPLANT
CABLE HIGH FREQUENCY MONO STRZ (ELECTRODE) IMPLANT
CLOTH BEACON ORANGE TIMEOUT ST (SAFETY) ×2 IMPLANT
DECANTER SPIKE VIAL GLASS SM (MISCELLANEOUS) IMPLANT
DERMABOND ADVANCED (GAUZE/BANDAGES/DRESSINGS) ×1
DERMABOND ADVANCED .7 DNX12 (GAUZE/BANDAGES/DRESSINGS) ×1 IMPLANT
DISSECT BALLN SPACEMKR + OVL (BALLOONS) ×2
DISSECTOR BALLN SPACEMKR + OVL (BALLOONS) ×1 IMPLANT
DISSECTOR BLUNT TIP ENDO 5MM (MISCELLANEOUS) IMPLANT
DRAPE LAPAROSCOPIC ABDOMINAL (DRAPES) ×2 IMPLANT
ELECT REM PT RETURN 9FT ADLT (ELECTROSURGICAL) ×2
ELECTRODE REM PT RTRN 9FT ADLT (ELECTROSURGICAL) ×1 IMPLANT
GLOVE EUDERMIC 7 POWDERFREE (GLOVE) ×2 IMPLANT
GOWN STRL NON-REIN LRG LVL3 (GOWN DISPOSABLE) ×2 IMPLANT
GOWN STRL REIN XL XLG (GOWN DISPOSABLE) ×4 IMPLANT
KIT BASIN OR (CUSTOM PROCEDURE TRAY) ×2 IMPLANT
MESH ULTRAPRO 3X6 7.6X15CM (Mesh General) ×2 IMPLANT
NEEDLE INSUFFLATION 14GA 120MM (NEEDLE) IMPLANT
SCISSORS LAP 5X35 DISP (ENDOMECHANICALS) IMPLANT
SET IRRIG TUBING LAPAROSCOPIC (IRRIGATION / IRRIGATOR) IMPLANT
SOLUTION ANTI FOG 6CC (MISCELLANEOUS) ×2 IMPLANT
STOPCOCK K 69 2C6206 (IV SETS) IMPLANT
STRIP CLOSURE SKIN 1/2X4 (GAUZE/BANDAGES/DRESSINGS) IMPLANT
SUT MNCRL AB 4-0 PS2 18 (SUTURE) ×2 IMPLANT
SUT VIC AB 3-0 SH 27 (SUTURE)
SUT VIC AB 3-0 SH 27XBRD (SUTURE) IMPLANT
TACKER 5MM HERNIA 3.5CML NAB (ENDOMECHANICALS) ×2 IMPLANT
TOWEL OR 17X26 10 PK STRL BLUE (TOWEL DISPOSABLE) ×2 IMPLANT
TRAY FOLEY CATH 14FRSI W/METER (CATHETERS) ×2 IMPLANT
TRAY LAP CHOLE (CUSTOM PROCEDURE TRAY) ×2 IMPLANT
TROCAR BLADELESS OPT 5 75 (ENDOMECHANICALS) IMPLANT
TROCAR CANNULA W/PORT DUAL 5MM (MISCELLANEOUS) ×2 IMPLANT
TUBING INSUFFLATION 10FT LAP (TUBING) ×2 IMPLANT

## 2012-07-11 NOTE — Progress Notes (Signed)
Awake feeling much better. Nausea relieved

## 2012-07-11 NOTE — OR Nursing (Signed)
9528 Ring taped to left ring finger by short stay. Patient understands the risk of injury to his finger, but wants to keep it on, he states he has not removed it in 48 years.

## 2012-07-11 NOTE — Progress Notes (Signed)
Up to BR with help. Voided small amt. Walked in hall

## 2012-07-11 NOTE — Transfer of Care (Signed)
Immediate Anesthesia Transfer of Care Note  Patient: Jeffrey Watkins  Procedure(s) Performed: Procedure(s): LAPAROSCOPIC INGUINAL HERNIA (Left) INSERTION OF MESH (Left)  Patient Location: PACU  Anesthesia Type:General  Level of Consciousness: awake, alert  and oriented  Airway & Oxygen Therapy: Patient Spontanous Breathing and Patient connected to face mask oxygen  Post-op Assessment: Report given to PACU RN and Post -op Vital signs reviewed and stable  Post vital signs: Reviewed and stable  Complications: No apparent anesthesia complications

## 2012-07-11 NOTE — Anesthesia Preprocedure Evaluation (Addendum)
Anesthesia Evaluation  Patient identified by MRN, date of birth, ID band Patient awake    Reviewed: Allergy & Precautions, H&P , NPO status , Patient's Chart, lab work & pertinent test results  Airway Mallampati: II TM Distance: >3 FB Neck ROM: Full    Dental  (+) Teeth Intact   Pulmonary neg pulmonary ROS,  breath sounds clear to auscultation  Pulmonary exam normal       Cardiovascular hypertension, Rhythm:Regular Rate:Normal     Neuro/Psych negative neurological ROS  negative psych ROS   GI/Hepatic negative GI ROS, Neg liver ROS,   Endo/Other  negative endocrine ROS  Renal/GU negative Renal ROS  negative genitourinary   Musculoskeletal negative musculoskeletal ROS (+)   Abdominal   Peds  Hematology negative hematology ROS (+)   Anesthesia Other Findings   Reproductive/Obstetrics negative OB ROS                          Anesthesia Physical Anesthesia Plan  ASA: II  Anesthesia Plan: General   Post-op Pain Management:    Induction: Intravenous  Airway Management Planned: Oral ETT  Additional Equipment:   Intra-op Plan:   Post-operative Plan: Extubation in OR  Informed Consent:   Dental advisory given  Plan Discussed with: CRNA  Anesthesia Plan Comments:         Anesthesia Quick Evaluation

## 2012-07-11 NOTE — Interval H&P Note (Signed)
History and Physical Interval Note:  07/11/2012 7:25 AM  Jeffrey Watkins  has presented today for surgery, with the diagnosis of left inguinal hernia  The goals and the various methods of treatment have been discussed with the patient and family. After consideration of risks, benefits and other options for treatment, the patient has consented to  Procedure(s): LAPAROSCOPIC INGUINAL HERNIA (Left) INSERTION OF MESH (Left) as a surgical intervention .  The patient's history has been reviewed, patient examined today, no change in status, stable for surgery.  I have reviewed the patient's chart and labs.  Questions were answered to the patient's satisfaction.     [name]

## 2012-07-11 NOTE — Progress Notes (Signed)
Med given for nausea (Phenergan)

## 2012-07-11 NOTE — Progress Notes (Signed)
Phenergan repeated. Pt still nauseated

## 2012-07-11 NOTE — Op Note (Signed)
Patient Name:           Jeffrey Watkins   Date of Surgery:        07/11/2012  Pre op Diagnosis:      Left inguinal hernia  Post op Diagnosis:    Indirect left inguinal hernia  Procedure:                 Laparoscopic, preperitoneal repair of left inguinal hernia with ultra Pro mesh  Surgeon:                     Angelia Mould. Derrell Lolling, M.D., FACS  Assistant:                      None  Operative Indications:   Jeffrey Watkins is a 72 y.o. male. He was referred by Dr. Marga Melnick for evaluation and management of a painful left inguinal hernia.  The patient has never had a hernia problem in the past. He's had pain and a bulge in his left groin for about 1-1/2 years. This has been progressive. It clearly has interfered with his hobby of backpacking and it is now interfering with your work. He says the bulge but generally goes back in. There's been no history consistent with incarceration or obstruction. On exam he has a medium sized, reducible left inguinal hernia. No hernia on the right. He is brought to the operating room electively.    Operative Findings:       The patient had a moderate to moderately large indirect left inguinal hernia. The indirect hernia sac was empty and it could be completely dissected out of the inguinal canal and back to above the level of the anterior superior iliac spine. There was no evidence of direct hernia or femoral hernia.  Procedure in Detail:          Following the induction of general endotracheal anesthesia the patient's abdomen and genitalia were prepped and draped in a sterile fashion. Prior to prepping and draping the bladder was emptied with a Foley catheter. Intravenous antibiotics were given. 0.5% Marcaine with epinephrine was used as a local infiltration anesthetic. The surgical time out was performed. A transverse incision was made just below the umbilicus. The fascia was incised transversely exposing the medial border of the left rectus muscle. The spacemaker  balloon was inserted into the left rectus sheath in the midline down to just above the symphysis pubis. Video camera was inserted. The dissector balloon was inflated with air under direct vision and the balloon deployed nicely. I could see the rectus muscles anteriorly, preperitoneal fat posteriorly, Cooper's ligaments bilaterally. It was held in place for 5 minutes and then deflated the balloon and removed it. The operating trocar balloon was inflated and secured and then connected to the insufflator. Video camera was inserted we had good visualization and no bleeding. I placed a 5 mm trocar in the midline below the umbilicus and dissected the areolar tissue and the peritoneum down off of the right lower abdominal wall. Once I could see the muscle layers I inserted a 5 mm trocar in the right lower quadrant. I then dissected the left inguinal area. All of the areolar tissue and peritoneum down laterally. I could  the inferior epigastric vessels anteriorly on the  rectus muscle. I dissected some fatty tissue away from the cord structures. I then mobilized the cord structures somewhat identifying a large indirect hernia sac. I dissected this away from the  cord structures and pulled it back above the level of the anterior superior iliac . There were no other hernias noted. A 3" x 6" piece of UltraPro mesh was inserted and positioned transversely so as to overlap the midline slightly and Cooper's ligament slightly. The mesh deployed laterally very nicely. The mesh was secured with a 5 mm Protacker, placing tacks along the superior rim of Cooper's ligament, up along the midline, across the posterior belly of the rectus muscle. Lateral to the inferior epigastric vessels I made sure that I could palpate the tacker through the abdominal wall to be sure that I stayed above the ileopubic track. The hernia sac was pulled back away from the lower edge of the mesh. i was satisfied with positioning. There was no bleeding. The  trocars were removed and the pneumoperitoneum released. The fascia at the umbilicus  was closed with 0 Vicryl sutures and the skin incisions closed with subcuticular sutures of 4-0 Monocryl and Dermabond. The patient tolerated the procedure well taken to recovery in stable condition. EBL 10 cc. Counts correct. Complications none.     Angelia Mould. Derrell Lolling, M.D., FACS General and Minimally Invasive Surgery Breast and Colorectal Surgery  07/11/2012 9:02 AM

## 2012-07-11 NOTE — Anesthesia Postprocedure Evaluation (Signed)
Anesthesia Post Note  Patient: Jeffrey Watkins  Procedure(s) Performed: Procedure(s) (LRB): LAPAROSCOPIC INGUINAL HERNIA (Left) INSERTION OF MESH (Left)  Anesthesia type: General  Patient location: PACU  Post pain: Pain level controlled  Post assessment: Post-op Vital signs reviewed  Last Vitals:  Filed Vitals:   07/11/12 0945  BP: 126/67  Pulse: 74  Temp: 36.7 C  Resp: 14    Post vital signs: Reviewed  Level of consciousness: sedated  Complications: No apparent anesthesia complications

## 2012-07-12 ENCOUNTER — Telehealth (INDEPENDENT_AMBULATORY_CARE_PROVIDER_SITE_OTHER): Payer: Self-pay | Admitting: General Surgery

## 2012-07-12 ENCOUNTER — Encounter (HOSPITAL_COMMUNITY): Payer: Self-pay | Admitting: General Surgery

## 2012-07-12 NOTE — Telephone Encounter (Signed)
Spoke with patient wife Teryl Lucy she is aware of appt  Po f/u for 07/27/12 at 1230  . She states he is doing well  Up and walking around with little to no pain .

## 2012-07-27 ENCOUNTER — Encounter (INDEPENDENT_AMBULATORY_CARE_PROVIDER_SITE_OTHER): Payer: Self-pay | Admitting: General Surgery

## 2012-07-27 ENCOUNTER — Ambulatory Visit (INDEPENDENT_AMBULATORY_CARE_PROVIDER_SITE_OTHER): Payer: Medicare Other | Admitting: General Surgery

## 2012-07-27 VITALS — BP 128/82 | HR 87 | Temp 98.5°F | Resp 18 | Ht 71.0 in | Wt 197.8 lb

## 2012-07-27 DIAGNOSIS — K4090 Unilateral inguinal hernia, without obstruction or gangrene, not specified as recurrent: Secondary | ICD-10-CM

## 2012-07-27 NOTE — Patient Instructions (Signed)
You appear to be healing very nicely following your laparoscopic left inguinal hernia repair.  No sports or heavy lifting more than 20 pounds until mid June. After that you may do whatever is comfortable  Return to see Dr. Derrell Lolling it you have any pain or swelling.

## 2012-07-27 NOTE — Progress Notes (Signed)
Patient ID: Jeffrey Watkins, male   DOB: 1940/10/06, 72 y.o.   MRN: [id number] History: This gentleman underwent elective laparoscopic repair of left inguinal hernia with mesh on 07/11/2012. He had a moderate to moderately large indirect left inguinal hernia. He states that he has done very well. He did not require any prescription pain medication. He has no pain or complaints.  Exam: Patient looks well. Good spirits. No distress. Abdomen soft periumbilical incision healing uneventfully. No infection or hernia. Left groin reveals a small seroma, nontender, everything else seems fine. His scrotum and testes normal. No hematoma or ecchymoses  Assessment left inguinal hernia, recovering uneventfully following elective repair with mesh laparoscopic  Plan: The sports or heavy lifting until mid June. After that resume normal activities Return to see me if there is any pain or swelling.   Angelia Mould. Derrell Lolling, M.D., [facility] General and Minimally invasive Surgery Breast and Colorectal Surgery Office:   [phone] Pager:   [phone]

## 2013-01-11 ENCOUNTER — Other Ambulatory Visit: Payer: Self-pay

## 2013-04-11 ENCOUNTER — Other Ambulatory Visit: Payer: Self-pay | Admitting: Internal Medicine

## 2013-04-12 NOTE — Telephone Encounter (Signed)
Med filled.  

## 2013-04-14 ENCOUNTER — Other Ambulatory Visit: Payer: Self-pay | Admitting: Internal Medicine

## 2013-04-16 NOTE — Telephone Encounter (Signed)
Rx was filled on 04-11-13.//AB/CMA

## 2013-05-29 ENCOUNTER — Encounter: Payer: Self-pay | Admitting: Internal Medicine

## 2013-05-29 ENCOUNTER — Other Ambulatory Visit (INDEPENDENT_AMBULATORY_CARE_PROVIDER_SITE_OTHER): Payer: Medicare Other

## 2013-05-29 ENCOUNTER — Ambulatory Visit (INDEPENDENT_AMBULATORY_CARE_PROVIDER_SITE_OTHER): Payer: Medicare Other | Admitting: Internal Medicine

## 2013-05-29 VITALS — BP 138/70 | HR 108 | Temp 98.2°F | Resp 13 | Ht 70.25 in | Wt 197.4 lb

## 2013-05-29 DIAGNOSIS — K5730 Diverticulosis of large intestine without perforation or abscess without bleeding: Secondary | ICD-10-CM

## 2013-05-29 DIAGNOSIS — Z87448 Personal history of other diseases of urinary system: Secondary | ICD-10-CM

## 2013-05-29 DIAGNOSIS — I10 Essential (primary) hypertension: Secondary | ICD-10-CM

## 2013-05-29 DIAGNOSIS — J019 Acute sinusitis, unspecified: Secondary | ICD-10-CM

## 2013-05-29 DIAGNOSIS — E785 Hyperlipidemia, unspecified: Secondary | ICD-10-CM

## 2013-05-29 DIAGNOSIS — Z0000 Encounter for general adult medical examination without abnormal findings: Secondary | ICD-10-CM

## 2013-05-29 LAB — CBC WITH DIFFERENTIAL/PLATELET
BASOS PCT: 0.4 % (ref 0.0–3.0)
Basophils Absolute: 0 10*3/uL (ref 0.0–0.1)
EOS ABS: 0.2 10*3/uL (ref 0.0–0.7)
Eosinophils Relative: 2.2 % (ref 0.0–5.0)
HEMATOCRIT: 45 % (ref 39.0–52.0)
HEMOGLOBIN: 15.1 g/dL (ref 13.0–17.0)
LYMPHS PCT: 24.9 % (ref 12.0–46.0)
Lymphs Abs: 2 10*3/uL (ref 0.7–4.0)
MCHC: 33.7 g/dL (ref 30.0–36.0)
MCV: 94.9 fl (ref 78.0–100.0)
MONO ABS: 0.7 10*3/uL (ref 0.1–1.0)
Monocytes Relative: 9.1 % (ref 3.0–12.0)
NEUTROS ABS: 5 10*3/uL (ref 1.4–7.7)
Neutrophils Relative %: 63.4 % (ref 43.0–77.0)
Platelets: 281 10*3/uL (ref 150.0–400.0)
RBC: 4.74 Mil/uL (ref 4.22–5.81)
RDW: 13.4 % (ref 11.5–14.6)
WBC: 7.9 10*3/uL (ref 4.5–10.5)

## 2013-05-29 LAB — LIPID PANEL
Cholesterol: 197 mg/dL (ref 0–200)
HDL: 49.4 mg/dL (ref >39.00)
LDL Cholesterol: 129 mg/dL — ABNORMAL HIGH (ref 0–99)
Total CHOL/HDL Ratio: 4
Triglycerides: 92 mg/dL (ref 0.0–149.0)
VLDL: 18.4 mg/dL (ref 0.0–40.0)

## 2013-05-29 LAB — BASIC METABOLIC PANEL
BUN: 15 mg/dL (ref 6–23)
CHLORIDE: 102 meq/L (ref 96–112)
CO2: 29 meq/L (ref 19–32)
Calcium: 9.8 mg/dL (ref 8.4–10.5)
Creatinine, Ser: 1.2 mg/dL (ref 0.4–1.5)
GFR: 64.37 mL/min (ref >60.00)
Glucose, Bld: 126 mg/dL — ABNORMAL HIGH (ref 70–99)
POTASSIUM: 4.6 meq/L (ref 3.5–5.1)
SODIUM: 139 meq/L (ref 135–145)

## 2013-05-29 LAB — HEPATIC FUNCTION PANEL
ALBUMIN: 4.2 g/dL (ref 3.5–5.2)
ALK PHOS: 57 U/L (ref 39–117)
ALT: 28 U/L (ref 0–53)
AST: 27 U/L (ref 0–37)
Bilirubin, Direct: 0.1 mg/dL (ref 0.0–0.3)
TOTAL PROTEIN: 6.9 g/dL (ref 6.0–8.3)
Total Bilirubin: 0.9 mg/dL (ref 0.3–1.2)

## 2013-05-29 LAB — TSH: TSH: 1.41 u[IU]/mL (ref 0.35–5.50)

## 2013-05-29 MED ORDER — AMOXICILLIN 500 MG PO CAPS: 500.0000 mg | ORAL_CAPSULE | Freq: Three times a day (TID) | ORAL | Status: DC

## 2013-05-29 MED ORDER — LOSARTAN POTASSIUM 50 MG PO TABS: ORAL_TABLET | ORAL | Status: DC

## 2013-05-29 NOTE — Progress Notes (Signed)
Subjective:    Patient ID: Jeffrey Watkins, male    DOB: October 11, 1940, 73 y.o.   MRN: [id number]  HPI Medicare Wellness Visit: Psychosocial and medical history were reviewed as required by Medicare (history related to abuse, antisocial behavior , firearm risk). Social history: Caffeine:1-2 cups/day  , Alcohol:very rare  , Tobacco KYH:CWCBJ Exercise:walking 30 min most days weather permitting Personal safety/fall risk:no Limitations of activities of daily living:no Seatbelt/ smoke alarm use:yes Healthcare Power of Attorney/Living Will status: not UTD Ophthalmologic exam status:UTD Hearing evaluation status:not UTD Orientation: Oriented X3 Memory and recall: good Math testing: good Depression/anxiety assessment: no Foreign travel history:[place] 2008 Immunization status for influenza/pneumonia/ shingles /tetanus:does not take Flu ; no shingles Transfusion history:no Preventive health care maintenance status: Colonoscopy as per protocol/standard care:UTD Dental care:every 6 months Chart reviewed and updated. Active issues reviewed and addressed as documented below.    Review of Systems In early March he was exposed to family members who had respiratory infections. He has had morning production of some greenish nasal discharge. He also has a cough with scant green sputum at times. There is no significant frontal headache or facial pain. No fever , chills or sweats. He's used Allegra which has helped some but he continues to have the cough  He also notices intermittent swelling and discomfort at the site of the hernia repair in May 2014.     Objective:   Physical Exam Gen.: Healthy and well-nourished in appearance. Alert, appropriate and cooperative throughout exam. Appears younger than stated age  Head: Normocephalic without obvious abnormalities;  pattern alopecia  Eyes: No corneal or conjunctival inflammation noted. Pupils equal round reactive to light and accommodation. Extraocular  motion intact.  Ears: External  ear exam reveals no significant lesions or deformities. Canals clear .TMs normal. Hearing is grossly normal bilaterally. Nose: External nasal exam reveals no deformity or inflammation. Nasal mucosa are pink and moist. No lesions or exudates noted.   Mouth: Oral mucosa and oropharynx reveal no lesions or exudates. Teeth in good repair. Neck: No deformities, masses, or tenderness noted. Range of motion & Thyroid normal. Lungs: Normal respiratory effort; chest expands symmetrically. Lungs are clear to auscultation without rales, wheezes, or increased work of breathing. Heart: Normal rate and rhythm. Normal S1 and S2. No gallop, click, or rub. Grade 1/2 over 6 systolic murmur. Abdomen: Bowel sounds normal; abdomen soft and nontender. No masses, organomegaly or hernias noted. Genitalia: Genitalia normal except for left varices. & R hydrocele. Prostate is normal without enlargement, asymmetry, nodularity, or induration                                  Musculoskeletal/extremities: No deformity or scoliosis noted of  the thoracic or lumbar spine.   No clubbing, cyanosis, edema, or significant extremity  deformity noted. Range of motion normal .Tone & strength normal. Hand joints normal  Fingernail health good. Able to lie down & sit up w/o help. Negative SLR bilaterally Vascular: Carotid, radial artery, dorsalis pedis and  posterior tibial pulses are full and equal. No bruits present. Neurologic: Alert and oriented x3. Deep tendon reflexes symmetrical and normal.  Gait normal .      Skin: Intact without suspicious lesions or rashes. Lymph: No cervical, axillary, or inguinal lymphadenopathy present. Psych: Mood and affect are normal. Normally interactive  Assessment & Plan:  #1 Medicare Wellness Exam; criteria met ; data entered #2 Problem List/Diagnoses reviewed #3 low grade sinusitis #4  no hernia Plan:  Assessments made/ Orders entered

## 2013-05-29 NOTE — Progress Notes (Signed)
Pre visit review using our clinic review tool, if applicable. No additional management support is needed unless otherwise documented below in the visit note. 

## 2013-05-29 NOTE — Patient Instructions (Signed)
Your next office appointment will be determined based upon review of your pending labs. Those instructions will be transmitted to you through My Chart   Followup as needed for your acute issue. Please report any significant change in your symptoms.Plain Mucinex (NOT D) for thick secretions ;force NON dairy fluids .   Nasal cleansing in the shower as discussed with lather of mild shampoo.After 10 seconds wash off lather while  exhaling through nostrils. Make sure that all residual soap is removed to prevent irritation.  Flonase OR Nasacort AQ 1 spray in each nostril twice a day as needed. Use the "crossover" technique into opposite nostril spraying toward opposite ear @ 45 degree angle, not straight up into nostril.  Use a Neti pot daily only  as needed for significant sinus congestion; going from open side to congested side . Plain Allegra (NOT D )  160 daily , Loratidine 10 mg , OR Zyrtec 10 mg @ bedtime  as needed for itchy eyes & sneezing.

## 2013-06-01 ENCOUNTER — Telehealth: Payer: Self-pay | Admitting: *Deleted

## 2013-06-01 ENCOUNTER — Ambulatory Visit: Payer: Medicare Other

## 2013-06-01 DIAGNOSIS — R7309 Other abnormal glucose: Secondary | ICD-10-CM

## 2013-06-01 DIAGNOSIS — M109 Gout, unspecified: Secondary | ICD-10-CM

## 2013-06-01 LAB — HEMOGLOBIN A1C: Hgb A1c MFr Bld: 6 % (ref 4.6–6.5)

## 2013-06-01 LAB — URIC ACID: URIC ACID, SERUM: 6.5 mg/dL (ref 4.0–7.8)

## 2013-06-01 NOTE — Telephone Encounter (Signed)
Added HgbA1c to recent lab work-Dx:790.29.//AB/CMA

## 2013-06-01 NOTE — Telephone Encounter (Signed)
Added Uric acid to recent lab results-Dx;274.9.//AB/CMA

## 2013-06-01 NOTE — Telephone Encounter (Signed)
Message copied by Harl Bowie on Fri Jun 01, 2013  5:15 PM ------      Message from: Hendricks Limes      Created: Fri Jun 01, 2013  8:53 AM       Please check on uric acid also ------

## 2013-06-01 NOTE — Telephone Encounter (Signed)
Message copied by Harl Bowie on Fri Jun 01, 2013  5:08 PM ------      Message from: Hendricks Limes      Created: Fri Jun 01, 2013  8:53 AM       Please check on uric acid also ------

## 2014-05-21 ENCOUNTER — Other Ambulatory Visit: Payer: Self-pay | Admitting: Internal Medicine

## 2014-06-04 ENCOUNTER — Other Ambulatory Visit (INDEPENDENT_AMBULATORY_CARE_PROVIDER_SITE_OTHER): Payer: Medicare PPO

## 2014-06-04 ENCOUNTER — Ambulatory Visit (INDEPENDENT_AMBULATORY_CARE_PROVIDER_SITE_OTHER): Payer: Medicare PPO | Admitting: Internal Medicine

## 2014-06-04 ENCOUNTER — Encounter: Payer: Self-pay | Admitting: Internal Medicine

## 2014-06-04 VITALS — BP 140/88 | HR 91 | Temp 98.4°F | Resp 16 | Ht 70.0 in | Wt 198.0 lb

## 2014-06-04 DIAGNOSIS — I10 Essential (primary) hypertension: Secondary | ICD-10-CM

## 2014-06-04 DIAGNOSIS — K5730 Diverticulosis of large intestine without perforation or abscess without bleeding: Secondary | ICD-10-CM

## 2014-06-04 DIAGNOSIS — E790 Hyperuricemia without signs of inflammatory arthritis and tophaceous disease: Secondary | ICD-10-CM | POA: Diagnosis not present

## 2014-06-04 DIAGNOSIS — E782 Mixed hyperlipidemia: Secondary | ICD-10-CM | POA: Diagnosis not present

## 2014-06-04 LAB — HEPATIC FUNCTION PANEL
ALT: 17 U/L (ref 0–53)
AST: 18 U/L (ref 0–37)
Albumin: 4.2 g/dL (ref 3.5–5.2)
Alkaline Phosphatase: 50 U/L (ref 39–117)
BILIRUBIN TOTAL: 0.6 mg/dL (ref 0.2–1.2)
Bilirubin, Direct: 0.1 mg/dL (ref 0.0–0.3)
TOTAL PROTEIN: 6.9 g/dL (ref 6.0–8.3)

## 2014-06-04 LAB — CBC WITH DIFFERENTIAL/PLATELET
BASOS ABS: 0 10*3/uL (ref 0.0–0.1)
Basophils Relative: 0.3 % (ref 0.0–3.0)
EOS ABS: 0.2 10*3/uL (ref 0.0–0.7)
EOS PCT: 2.5 % (ref 0.0–5.0)
HCT: 43.8 % (ref 39.0–52.0)
HEMOGLOBIN: 15.1 g/dL (ref 13.0–17.0)
Lymphocytes Relative: 26.7 % (ref 12.0–46.0)
Lymphs Abs: 1.8 10*3/uL (ref 0.7–4.0)
MCHC: 34.5 g/dL (ref 30.0–36.0)
MCV: 92.2 fl (ref 78.0–100.0)
MONO ABS: 0.6 10*3/uL (ref 0.1–1.0)
MONOS PCT: 9.5 % (ref 3.0–12.0)
NEUTROS ABS: 4.1 10*3/uL (ref 1.4–7.7)
Neutrophils Relative %: 61 % (ref 43.0–77.0)
PLATELETS: 220 10*3/uL (ref 150.0–400.0)
RBC: 4.75 Mil/uL (ref 4.22–5.81)
RDW: 13.4 % (ref 11.5–15.5)
WBC: 6.8 10*3/uL (ref 4.0–10.5)

## 2014-06-04 LAB — URIC ACID: URIC ACID, SERUM: 7.1 mg/dL (ref 4.0–7.8)

## 2014-06-04 LAB — BASIC METABOLIC PANEL
BUN: 14 mg/dL (ref 6–23)
CALCIUM: 9.8 mg/dL (ref 8.4–10.5)
CHLORIDE: 104 meq/L (ref 96–112)
CO2: 29 meq/L (ref 19–32)
Creatinine, Ser: 1.13 mg/dL (ref 0.40–1.50)
GFR: 67.47 mL/min (ref >60.00)
Glucose, Bld: 113 mg/dL — ABNORMAL HIGH (ref 70–99)
Potassium: 5 mEq/L (ref 3.5–5.1)
SODIUM: 138 meq/L (ref 135–145)

## 2014-06-04 LAB — LIPID PANEL
CHOLESTEROL: 184 mg/dL (ref 0–200)
HDL: 51.9 mg/dL (ref >39.00)
LDL Cholesterol: 115 mg/dL — ABNORMAL HIGH (ref 0–99)
NonHDL: 132.1
TRIGLYCERIDES: 85 mg/dL (ref 0.0–149.0)
Total CHOL/HDL Ratio: 4
VLDL: 17 mg/dL (ref 0.0–40.0)

## 2014-06-04 LAB — TSH: TSH: 2.03 u[IU]/mL (ref 0.35–4.50)

## 2014-06-04 MED ORDER — LOSARTAN POTASSIUM 50 MG PO TABS: ORAL_TABLET | ORAL | Status: DC

## 2014-06-04 NOTE — Assessment & Plan Note (Signed)
CBC & dif 

## 2014-06-04 NOTE — Patient Instructions (Addendum)
  Your next office appointment will be determined based upon review of your pending labs.  Those instructions will be transmitted to you by My Chart  Critical results will be called.   Followup as needed for any active or acute issue. Please report any significant change in your symptoms.  Minimal Blood Pressure Goal= AVERAGE < 140/90;  Ideal is an AVERAGE < 135/85. This AVERAGE should be calculated from @ least 5-7 BP readings taken @ different times of day on different days of week. You should not respond to isolated BP readings , but rather the AVERAGE for that week .Please bring your  blood pressure cuff to office visits to verify that it is reliable.It  can also be checked againsct the blood pressure device at the pharmacy. Finger or wrist cuffs are not dependable; an arm cuff is.

## 2014-06-04 NOTE — Progress Notes (Signed)
Pre visit review using our clinic review tool, if applicable. No additional management support is needed unless otherwise documented below in the visit note. 

## 2014-06-04 NOTE — Assessment & Plan Note (Signed)
Lipids, LFTs, TSH  

## 2014-06-04 NOTE — Progress Notes (Signed)
Subjective:    Patient ID: Jeffrey Watkins, male    DOB: May 28, 1940, 74 y.o.   MRN: [id number]  HPI The patient is here to assess status of active health conditions.  PMH, FH, & Social History reviewed & updated.  He has been compliant with his losartan without adverse effect. He is on a heart healthy, low-salt diet. He will walk 30 minutes at least 5 times a week without associated cardiopulmonary symptoms.  Blood pressure averages 120/70 at home.  Based on advanced cholesterol testing his LDL goal is less than 100, ideally less than 70. He has been statin intolerant. There is no family history of premature coronary disease  He is due for colonoscopy in October of this year. He has no active GI symptoms  He has a history of uric acid stones. Previously he has had lithotripsy. He has no active genitourinary symptoms at this time except nocturia usually X 1.   Review of Systems   Chest pain, palpitations, tachycardia, exertional dyspnea, paroxysmal nocturnal dyspnea, claudication or edema are absent.  Unexplained weight loss, abdominal pain, significant dyspepsia, dysphagia, melena, rectal bleeding, or persistently small caliber stools are denied.  Dysuria, pyuria, hematuria, frequency,  or polyuria are denied.     Objective:   Physical Exam Gen.: Adequately nourished in appearance. Alert, appropriate and cooperative throughout exam.  Head: Normocephalic without obvious abnormalities;  pattern alopecia  Eyes: No corneal or conjunctival inflammation noted. Pupils equal round reactive to light and accommodation. Extraocular motion intact.  Ears: External  ear exam reveals no significant lesions or deformities. Canals clear .TMs normal. Hearing is grossly normal bilaterally. Nose: External nasal exam reveals no deformity or inflammation. Nasal mucosa are pink and moist. No lesions or exudates noted.   Mouth: Oral mucosa and oropharynx reveal no lesions or exudates. Teeth in good  repair. Neck: No deformities, masses, or tenderness noted. Range of motion & Thyroid normal. Lungs: Normal respiratory effort; chest expands symmetrically. Lungs are clear to auscultation without rales, wheezes, or increased work of breathing. Heart: Normal rate and rhythm. Normal S1 and S2. No gallop, click, or rub. Grade 1 systolic murmur right base. Abdomen: Bowel sounds normal; abdomen soft and nontender. No masses, organomegaly or hernias noted. Genitalia: Large right hydrocele present. Prostate is normal without enlargement, asymmetry, nodularity, or induration       Musculoskeletal/extremities: No deformity or scoliosis noted of  the thoracic or lumbar spine.  No clubbing, cyanosis, edema, or significant extremity  deformity noted.  Range of motion normal . Tone & strength normal. Hand joints normal except for minor flexion contraction of the right fifth digit Fingernail  health good. Slight crepitus of knees  Able to lie down & sit up w/o help.  Negative SLR bilaterally Vascular: Carotid, radial artery, dorsalis pedis and  posterior tibial pulses are full and equal. No bruits present. Neurologic: Alert and oriented x3. Deep tendon reflexes symmetrical and normal.  Gait normal       Skin: Intact without suspicious lesions or rashes. Lymph: No cervical, axillary, or inguinal lymphadenopathy present. Psych: Mood and affect are normal. Normally interactive  Assessment & Plan:  See Current Assessment & Plan in Problem List under specific Diagnosis

## 2014-06-04 NOTE — Assessment & Plan Note (Signed)
Uric acid

## 2014-06-04 NOTE — Assessment & Plan Note (Signed)
Blood pressure goals reviewed. BMET 

## 2014-06-06 ENCOUNTER — Encounter: Payer: Self-pay | Admitting: Internal Medicine

## 2014-06-06 ENCOUNTER — Other Ambulatory Visit (INDEPENDENT_AMBULATORY_CARE_PROVIDER_SITE_OTHER): Payer: Medicare PPO

## 2014-06-06 ENCOUNTER — Telehealth: Payer: Self-pay

## 2014-06-06 DIAGNOSIS — R739 Hyperglycemia, unspecified: Secondary | ICD-10-CM

## 2014-06-06 LAB — HEMOGLOBIN A1C: HEMOGLOBIN A1C: 5.9 % (ref 4.6–6.5)

## 2014-06-06 NOTE — Telephone Encounter (Signed)
Request for add on has been faxed to lab 

## 2014-06-06 NOTE — Telephone Encounter (Signed)
-----   Message from Hendricks Limes, MD sent at 06/06/2014  6:12 AM EDT ----- Please add A1c (R73.9)

## 2014-10-07 DIAGNOSIS — L570 Actinic keratosis: Secondary | ICD-10-CM | POA: Diagnosis not present

## 2014-10-07 DIAGNOSIS — L720 Epidermal cyst: Secondary | ICD-10-CM | POA: Diagnosis not present

## 2014-10-07 DIAGNOSIS — D171 Benign lipomatous neoplasm of skin and subcutaneous tissue of trunk: Secondary | ICD-10-CM | POA: Diagnosis not present

## 2014-10-07 DIAGNOSIS — D1801 Hemangioma of skin and subcutaneous tissue: Secondary | ICD-10-CM | POA: Diagnosis not present

## 2014-10-07 DIAGNOSIS — D485 Neoplasm of uncertain behavior of skin: Secondary | ICD-10-CM | POA: Diagnosis not present

## 2014-10-07 DIAGNOSIS — L821 Other seborrheic keratosis: Secondary | ICD-10-CM | POA: Diagnosis not present

## 2014-10-07 DIAGNOSIS — Z85828 Personal history of other malignant neoplasm of skin: Secondary | ICD-10-CM | POA: Diagnosis not present

## 2014-10-07 DIAGNOSIS — D225 Melanocytic nevi of trunk: Secondary | ICD-10-CM | POA: Diagnosis not present

## 2014-11-20 DIAGNOSIS — H25813 Combined forms of age-related cataract, bilateral: Secondary | ICD-10-CM | POA: Diagnosis not present

## 2014-11-20 DIAGNOSIS — H43813 Vitreous degeneration, bilateral: Secondary | ICD-10-CM | POA: Diagnosis not present

## 2014-11-20 DIAGNOSIS — H524 Presbyopia: Secondary | ICD-10-CM | POA: Diagnosis not present

## 2014-11-20 DIAGNOSIS — H31002 Unspecified chorioretinal scars, left eye: Secondary | ICD-10-CM | POA: Diagnosis not present

## 2015-03-07 ENCOUNTER — Telehealth: Payer: Self-pay | Admitting: Internal Medicine

## 2015-03-07 NOTE — Telephone Encounter (Signed)
Is requesting to transfer from [facility] to [facility]

## 2015-03-10 NOTE — Telephone Encounter (Signed)
yes

## 2015-03-12 NOTE — Telephone Encounter (Signed)
Left VM to call back to schedule next appt with Dr. Ronnald Ramp

## 2015-06-11 ENCOUNTER — Encounter: Payer: Self-pay | Admitting: Internal Medicine

## 2015-06-11 ENCOUNTER — Ambulatory Visit (INDEPENDENT_AMBULATORY_CARE_PROVIDER_SITE_OTHER): Payer: Medicare Other | Admitting: Internal Medicine

## 2015-06-11 ENCOUNTER — Other Ambulatory Visit (INDEPENDENT_AMBULATORY_CARE_PROVIDER_SITE_OTHER): Payer: Medicare Other

## 2015-06-11 VITALS — BP 120/70 | HR 90 | Temp 98.3°F | Resp 16 | Ht 70.0 in | Wt 199.0 lb

## 2015-06-11 DIAGNOSIS — R3129 Other microscopic hematuria: Secondary | ICD-10-CM

## 2015-06-11 DIAGNOSIS — E790 Hyperuricemia without signs of inflammatory arthritis and tophaceous disease: Secondary | ICD-10-CM

## 2015-06-11 DIAGNOSIS — Z0000 Encounter for general adult medical examination without abnormal findings: Secondary | ICD-10-CM | POA: Diagnosis not present

## 2015-06-11 DIAGNOSIS — E782 Mixed hyperlipidemia: Secondary | ICD-10-CM | POA: Diagnosis not present

## 2015-06-11 DIAGNOSIS — N401 Enlarged prostate with lower urinary tract symptoms: Secondary | ICD-10-CM

## 2015-06-11 DIAGNOSIS — Z23 Encounter for immunization: Secondary | ICD-10-CM

## 2015-06-11 DIAGNOSIS — R351 Nocturia: Secondary | ICD-10-CM | POA: Diagnosis not present

## 2015-06-11 DIAGNOSIS — K4091 Unilateral inguinal hernia, without obstruction or gangrene, recurrent: Secondary | ICD-10-CM

## 2015-06-11 DIAGNOSIS — R739 Hyperglycemia, unspecified: Secondary | ICD-10-CM | POA: Diagnosis not present

## 2015-06-11 DIAGNOSIS — I10 Essential (primary) hypertension: Secondary | ICD-10-CM | POA: Diagnosis not present

## 2015-06-11 LAB — CBC WITH DIFFERENTIAL/PLATELET
BASOS ABS: 0 10*3/uL (ref 0.0–0.1)
Basophils Relative: 0.1 % (ref 0.0–3.0)
EOS PCT: 2.9 % (ref 0.0–5.0)
Eosinophils Absolute: 0.3 10*3/uL (ref 0.0–0.7)
HCT: 44.7 % (ref 39.0–52.0)
HEMOGLOBIN: 15.3 g/dL (ref 13.0–17.0)
Lymphocytes Relative: 22.7 % (ref 12.0–46.0)
Lymphs Abs: 2.1 10*3/uL (ref 0.7–4.0)
MCHC: 34.1 g/dL (ref 30.0–36.0)
MCV: 93.2 fl (ref 78.0–100.0)
MONO ABS: 0.8 10*3/uL (ref 0.1–1.0)
MONOS PCT: 8.4 % (ref 3.0–12.0)
Neutro Abs: 6.1 10*3/uL (ref 1.4–7.7)
Neutrophils Relative %: 65.9 % (ref 43.0–77.0)
Platelets: 252 10*3/uL (ref 150.0–400.0)
RBC: 4.79 Mil/uL (ref 4.22–5.81)
RDW: 13.6 % (ref 11.5–15.5)
WBC: 9.2 10*3/uL (ref 4.0–10.5)

## 2015-06-11 LAB — LIPID PANEL
CHOLESTEROL: 219 mg/dL — AB (ref 0–200)
HDL: 52.8 mg/dL (ref >39.00)
LDL Cholesterol: 144 mg/dL — ABNORMAL HIGH (ref 0–99)
NonHDL: 166.47
TRIGLYCERIDES: 112 mg/dL (ref 0.0–149.0)
Total CHOL/HDL Ratio: 4
VLDL: 22.4 mg/dL (ref 0.0–40.0)

## 2015-06-11 LAB — URINALYSIS, ROUTINE W REFLEX MICROSCOPIC
Bilirubin Urine: NEGATIVE
Ketones, ur: NEGATIVE
Leukocytes, UA: NEGATIVE
NITRITE: NEGATIVE
Specific Gravity, Urine: 1.02 (ref 1.000–1.030)
TOTAL PROTEIN, URINE-UPE24: NEGATIVE
URINE GLUCOSE: NEGATIVE
UROBILINOGEN UA: 0.2 (ref 0.0–1.0)
pH: 6 (ref 5.0–8.0)

## 2015-06-11 LAB — HEMOGLOBIN A1C: HEMOGLOBIN A1C: 6 % (ref 4.6–6.5)

## 2015-06-11 LAB — COMPREHENSIVE METABOLIC PANEL
ALBUMIN: 4.4 g/dL (ref 3.5–5.2)
ALK PHOS: 51 U/L (ref 39–117)
ALT: 17 U/L (ref 0–53)
AST: 17 U/L (ref 0–37)
BUN: 15 mg/dL (ref 6–23)
CO2: 31 mEq/L (ref 19–32)
Calcium: 10.1 mg/dL (ref 8.4–10.5)
Chloride: 104 mEq/L (ref 96–112)
Creatinine, Ser: 1.13 mg/dL (ref 0.40–1.50)
GFR: 67.29 mL/min (ref >60.00)
Glucose, Bld: 114 mg/dL — ABNORMAL HIGH (ref 70–99)
POTASSIUM: 5 meq/L (ref 3.5–5.1)
Sodium: 141 mEq/L (ref 135–145)
TOTAL PROTEIN: 6.7 g/dL (ref 6.0–8.3)
Total Bilirubin: 0.5 mg/dL (ref 0.2–1.2)

## 2015-06-11 LAB — URIC ACID: URIC ACID, SERUM: 6.9 mg/dL (ref 4.0–7.8)

## 2015-06-11 LAB — PSA: PSA: 1.16 ng/mL (ref 0.10–4.00)

## 2015-06-11 LAB — TSH: TSH: 3.12 u[IU]/mL (ref 0.35–4.50)

## 2015-06-11 MED ORDER — LOSARTAN POTASSIUM 50 MG PO TABS: ORAL_TABLET | ORAL | Status: DC

## 2015-06-11 NOTE — Progress Notes (Signed)
Subjective:  Patient ID: Jeffrey Watkins, male    DOB: 02/17/1941  Age: 75 y.o. MRN: [id number]  CC: Hyperlipidemia; Hypertension; and Annual Exam  NEW TO ME  HPI Jeffrey Watkins presents for a CPX - He has a history of hypertension which is treated by losartan. He tells me his blood pressures have been well controlled. He denies headache, chest pain, shortness of breath, blurred vision, or dyspnea on exertion.  He complains that he thinks he may have a recurrent hernia in his left groin. He had hernia repair years ago with a mesh and says he feels a bulge that is sometimes uncomfortable in the left groin.  He has a history of hypercholesterolemia but is intolerant to statins.  History Jeffrey Watkins has a past medical history of Hyperlipidemia; HTN (hypertension); and Hydrocele.   He has past surgical history that includes Lithotripsy; Colonoscopy; urinary stent (1996 or 1997); Inguinal hernia repair (Left, 07/11/2012); and Insertion of mesh (Left, 07/11/2012).   His family history includes Aneurysm (age of onset: 26) in his brother; Heart attack in his mother; Heart attack (age of onset: 51) in his father; Transient ischemic attack in his maternal grandmother. There is no history of Cancer or Diabetes.He reports that he has never smoked. He does not have any smokeless tobacco history on file. He reports that he drinks alcohol. He reports that he does not use illicit drugs.  Outpatient Prescriptions Prior to Visit  Medication Sig Dispense Refill  . AMBULATORY NON FORMULARY MEDICATION Take 1 capsule by mouth 3 (three) times daily. Amyloid Clear:1500mg     . AMBULATORY NON FORMULARY MEDICATION Take 2,100 mg by mouth daily. L-ARGININE    . Ascorbic Acid (VITAMIN C) 1000 MG tablet Take 1,000 mg by mouth daily.     Marland Kitchen aspirin 81 MG tablet Take 81 mg by mouth daily.     Marland Kitchen b complex vitamins tablet Take 1 tablet by mouth daily.     . Calcium-Magnesium (CAL-MAG PO) Take 1 tablet by mouth 2 (two) times daily.      . Calcium-Magnesium-Vitamin D (CALCIUM MAGNESIUM PO) Take by mouth daily. 1 tablet twice daily    . Cholecalciferol (VITAMIN D) 2000 UNITS CAPS Take 1 capsule by mouth daily.     . Coenzyme Q10 (COQ10 PO) Take 100 mg by mouth 3 (three) times daily.     Marland Kitchen GRAPE SEED EXTRACT PO Take 150 mg by mouth daily. At lunch    . LYCOPENE PO Take 15 mg by mouth daily.     . Melatonin 3 MG TABS Take 1 tablet by mouth at bedtime. 3 mg    . Multiple Vitamin (MULTIVITAMIN) tablet Take 1 tablet by mouth daily.     Marland Kitchen NIACIN PO Take 500 mg by mouth 3 (three) times daily.     . Omega-3 Fatty Acids (FISH OIL CONCENTRATE PO) Take 1 capsule by mouth 2 (two) times daily.     Marland Kitchen PHYTOSTEROLS PO Take 1 tablet by mouth. As needed before meals w/ cholesterol    . Probiotic Product (PROBIOTIC DAILY PO) Take 1 tablet by mouth daily with breakfast. 20 mg    . RESVERATROL PO Take 1 tablet by mouth 2 (two) times daily. 200 mg daily    . Specialty Vitamins Products (PROSTATE PO) Take 5 mLs by mouth daily. 6 grams daily    . TOCOPHEROLS-TOCOTRIENOLS PO Take 1 tablet by mouth daily. 50 mg daily    . vitamin E 100 UNIT capsule Take 100 Units  by mouth 2 (two) times daily. With calcium - beta cal e antioxidant    . losartan (COZAAR) 50 MG tablet TAKE 1 TABLET (50 MG TOTAL) BY MOUTH DAILY. 90 tablet 3   No facility-administered medications prior to visit.    ROS Review of Systems  Constitutional: Negative.   HENT: Negative.   Eyes: Negative.  Negative for visual disturbance.  Respiratory: Negative.  Negative for cough, choking, chest tightness, shortness of breath and stridor.   Cardiovascular: Negative.  Negative for chest pain, palpitations and leg swelling.  Gastrointestinal: Negative.  Negative for nausea, vomiting, abdominal pain, diarrhea, constipation and blood in stool.  Endocrine: Negative.   Genitourinary: Negative.  Negative for dysuria, urgency, frequency, hematuria, flank pain, decreased urine volume, penile  swelling, scrotal swelling, enuresis, difficulty urinating and testicular pain.       He has nocturia about one time per night  Musculoskeletal: Negative.  Negative for myalgias, back pain, arthralgias and neck pain.  Skin: Negative.  Negative for pallor and rash.  Allergic/Immunologic: Negative.   Neurological: Negative.  Negative for dizziness.  Hematological: Negative.  Negative for adenopathy. Does not bruise/bleed easily.  Psychiatric/Behavioral: Negative.     Objective:  BP 120/70 mmHg  Pulse 90  Temp(Src) 98.3 F (36.8 C) (Oral)  Resp 16  Ht 5\' 10"  (1.778 m)  Wt 199 lb (90.266 kg)  BMI 28.55 kg/m2  SpO2 93%  Physical Exam  Constitutional: He is oriented to person, place, and time. No distress.  HENT:  Head: Normocephalic and atraumatic.  Mouth/Throat: Oropharynx is clear and moist. No oropharyngeal exudate.  Eyes: Conjunctivae are normal. Right eye exhibits no discharge. Left eye exhibits no discharge. No scleral icterus.  Neck: Normal range of motion. Neck supple. No JVD present. No tracheal deviation present. No thyromegaly present.  Cardiovascular: Normal rate, regular rhythm, normal heart sounds and intact distal pulses.  Exam reveals no gallop and no friction rub.   No murmur heard. Pulmonary/Chest: Effort normal and breath sounds normal. No stridor. No respiratory distress. He has no wheezes. He has no rales. He exhibits no tenderness.  Abdominal: Soft. Normal appearance and bowel sounds are normal. He exhibits no distension and no mass. There is no hepatosplenomegaly, splenomegaly or hepatomegaly. There is no tenderness. There is no rebound, no guarding and no CVA tenderness. A hernia is present. Hernia confirmed positive in the ventral area and confirmed positive in the left inguinal area. Hernia confirmed negative in the right inguinal area.  Genitourinary: Rectum normal, testes normal and penis normal. Rectal exam shows no external hemorrhoid, no internal hemorrhoid,  no fissure, no mass, no tenderness and anal tone normal. Guaiac negative stool. Prostate is enlarged (2+ smooth symm BPH). Prostate is not tender. Right testis shows no mass, no swelling and no tenderness. Right testis is descended. Left testis shows no mass, no swelling and no tenderness. Left testis is descended. Circumcised. No penile erythema or penile tenderness. No discharge found.  Musculoskeletal: Normal range of motion. He exhibits no edema or tenderness.  Lymphadenopathy:    He has no cervical adenopathy.       Right: No inguinal adenopathy present.       Left: No inguinal adenopathy present.  Neurological: He is oriented to person, place, and time.  Skin: Skin is warm and dry. No rash noted. He is not diaphoretic. No erythema. No pallor.  Psychiatric: He has a normal mood and affect. His behavior is normal. Judgment and thought content normal.  Vitals reviewed.  Lab Results  Component Value Date   WBC 9.2 06/11/2015   HGB 15.3 06/11/2015   HCT 44.7 06/11/2015   PLT 252.0 06/11/2015   GLUCOSE 114* 06/11/2015   CHOL 219* 06/11/2015   TRIG 112.0 06/11/2015   HDL 52.80 06/11/2015   LDLDIRECT 138.8 04/17/2012   LDLCALC 144* 06/11/2015   ALT 17 06/11/2015   AST 17 06/11/2015   NA 141 06/11/2015   K 5.0 06/11/2015   CL 104 06/11/2015   CREATININE 1.13 06/11/2015   BUN 15 06/11/2015   CO2 31 06/11/2015   TSH 3.12 06/11/2015   PSA 1.16 06/11/2015   HGBA1C 6.0 06/11/2015   MICROALBUR 0.7 02/22/2006     Assessment & Plan:   Akshaj was seen today for hyperlipidemia, hypertension and annual exam.  Diagnoses and all orders for this visit:  Essential hypertension- His blood pressure is adequately well controlled, electrolytes and renal function are stable -     Comprehensive metabolic panel; Future -     CBC with Differential/Platelet; Future -     TSH; Future -     losartan (COZAAR) 50 MG tablet; TAKE 1 TABLET (50 MG TOTAL) BY MOUTH DAILY.  Hyperglycemia- his A1c is up  to 6.0%, he has prediabetes, no medications are needed at this time, he will work on his lifestyle modifications. -     Comprehensive metabolic panel; Future -     Hemoglobin A1c; Future  Hyperuricemia- improvement noted -     Uric acid; Future  Mixed hyperlipidemia- his Framingham risk score is 12%, he is not willing to take a statin for risk reduction -     Lipid panel; Future  BPH associated with nocturia- his PSA is normal, he has a few red blood cells in his urine, there are no other signs or symptoms of infection, his only complaint is mild nocturia and he does not want to start a treatment for that -     Urinalysis, Routine w reflex microscopic (not at [facility]); Future -     PSA; Future  Unilateral recurrent inguinal hernia without obstruction or gangrene- I've asked him to follow-up with general surgery regarding this -     Ambulatory referral to General Surgery  Routine general medical examination at a health care facility  Need for vaccination with 13-polyvalent pneumococcal conjugate vaccine -     Pneumococcal conjugate vaccine 13-valent  Hematuria, microscopic- he has a significant amount of blood in his urine, I've ordered a CT renal protocol to look for mass or stone -     CT RENAL STONE STUDY; Future   I am having Mr. Jeffrey Watkins maintain his vitamin C, Calcium-Magnesium-Vitamin D (CALCIUM MAGNESIUM PO), multivitamin, GRAPE SEED EXTRACT PO, TOCOPHEROLS-TOCOTRIENOLS PO, aspirin, Specialty Vitamins Products (PROSTATE PO), Omega-3 Fatty Acids (FISH OIL CONCENTRATE PO), LYCOPENE PO, Vitamin D, b complex vitamins, Coenzyme Q10 (COQ10 PO), NIACIN PO, AMBULATORY NON FORMULARY MEDICATION, AMBULATORY NON FORMULARY MEDICATION, vitamin E, RESVERATROL PO, Calcium-Magnesium (CAL-MAG PO), Melatonin, Probiotic Product (PROBIOTIC DAILY PO), PHYTOSTEROLS PO, and losartan.  Meds ordered this encounter  Medications  . losartan (COZAAR) 50 MG tablet    Sig: TAKE 1 TABLET (50 MG TOTAL) BY MOUTH  DAILY.    Dispense:  90 tablet    Refill:  3   See AVS for instructions about healthy living and anticipatory guidance.  Follow-up: Return in about 6 months (around 12/11/2015).  Scarlette Calico, MD

## 2015-06-11 NOTE — Patient Instructions (Signed)

## 2015-06-11 NOTE — Assessment & Plan Note (Signed)

## 2015-06-11 NOTE — Progress Notes (Signed)
Pre visit review using our clinic review tool, if applicable. No additional management support is needed unless otherwise documented below in the visit note. 

## 2015-06-12 ENCOUNTER — Inpatient Hospital Stay: Admission: RE | Admit: 2015-06-12 | Payer: Medicare Other | Source: Ambulatory Visit

## 2015-07-01 ENCOUNTER — Other Ambulatory Visit: Payer: Self-pay | Admitting: Internal Medicine

## 2015-07-01 ENCOUNTER — Encounter: Payer: Self-pay | Admitting: Internal Medicine

## 2015-07-01 ENCOUNTER — Telehealth: Payer: Self-pay | Admitting: Internal Medicine

## 2015-07-01 ENCOUNTER — Ambulatory Visit (INDEPENDENT_AMBULATORY_CARE_PROVIDER_SITE_OTHER)
Admission: RE | Admit: 2015-07-01 | Discharge: 2015-07-01 | Disposition: A | Discharge status: Home / Self Care | Payer: Medicare Other | Source: Ambulatory Visit | Attending: Internal Medicine | Admitting: Internal Medicine

## 2015-07-01 DIAGNOSIS — R3129 Other microscopic hematuria: Secondary | ICD-10-CM

## 2015-07-01 DIAGNOSIS — K4091 Unilateral inguinal hernia, without obstruction or gangrene, recurrent: Secondary | ICD-10-CM

## 2015-07-01 DIAGNOSIS — R160 Hepatomegaly, not elsewhere classified: Secondary | ICD-10-CM

## 2015-07-01 DIAGNOSIS — N2889 Other specified disorders of kidney and ureter: Secondary | ICD-10-CM

## 2015-07-01 DIAGNOSIS — K469 Unspecified abdominal hernia without obstruction or gangrene: Secondary | ICD-10-CM | POA: Insufficient documentation

## 2015-07-01 NOTE — Telephone Encounter (Signed)
done

## 2015-07-01 NOTE — Telephone Encounter (Signed)
[facility] called request order change for MRI, it need to be MRI abdomen with and without contrast. Please help

## 2015-07-10 ENCOUNTER — Ambulatory Visit
Admission: RE | Admit: 2015-07-10 | Discharge: 2015-07-10 | Disposition: A | Discharge status: Home / Self Care | Payer: Medicare Other | Source: Ambulatory Visit | Attending: Internal Medicine | Admitting: Internal Medicine

## 2015-07-10 ENCOUNTER — Encounter: Payer: Self-pay | Admitting: Internal Medicine

## 2015-07-10 DIAGNOSIS — N2889 Other specified disorders of kidney and ureter: Secondary | ICD-10-CM

## 2015-07-10 DIAGNOSIS — R3129 Other microscopic hematuria: Secondary | ICD-10-CM

## 2015-07-10 DIAGNOSIS — R160 Hepatomegaly, not elsewhere classified: Secondary | ICD-10-CM

## 2015-07-10 MED ORDER — GADOBENATE DIMEGLUMINE 529 MG/ML IV SOLN
19.0000 mL | Freq: Once | INTRAVENOUS | Status: AC | PRN
  Administered 2015-07-10: 19 mL via INTRAVENOUS

## 2015-07-20 ENCOUNTER — Other Ambulatory Visit: Payer: Self-pay | Admitting: Internal Medicine

## 2015-07-20 DIAGNOSIS — K4091 Unilateral inguinal hernia, without obstruction or gangrene, recurrent: Secondary | ICD-10-CM

## 2015-07-21 ENCOUNTER — Other Ambulatory Visit: Payer: Self-pay | Admitting: General Surgery

## 2015-08-11 ENCOUNTER — Other Ambulatory Visit: Payer: Self-pay | Admitting: *Deleted

## 2015-08-11 DIAGNOSIS — I10 Essential (primary) hypertension: Secondary | ICD-10-CM

## 2015-08-11 MED ORDER — LOSARTAN POTASSIUM 50 MG PO TABS: ORAL_TABLET | ORAL | Status: DC

## 2015-08-19 NOTE — Patient Instructions (Addendum)
TOMISLAV MARE  08/19/2015   Your procedure is scheduled on: 08/26/2015    Report to [facility]  Entrance take [facility]  elevators to 3rd floor to  [place] at   530 AM.  Call this number if you have problems the morning of surgery [phone]   Remember: ONLY 1 PERSON MAY GO WITH YOU TO SHORT STAY TO GET  READY MORNING OF [place].  Do not eat food or drink liquids :After Midnight.     Take these medicines the morning of surgery with A SIP OF WATER: none                                 You may not have any metal on your body including hair pins and              piercings  Do not wear jewelry,  lotions, powders or perfumes, deodorant                    Men may shave face and neck.   Do not bring valuables to the hospital. [place].  Contacts, dentures or bridgework may not be worn into surgery.      Patients discharged the day of surgery will not be allowed to drive home.  Name and phone number of your driver:  Special Instructions: coughing and deep breathing exercises,leg exercises               Please read over the following fact sheets you were given: _____________________________________________________________________             [facility] - Preparing for Surgery Before surgery, you can play an important role.  Because skin is not sterile, your skin needs to be as free of germs as possible.  You can reduce the number of germs on your skin by washing with CHG (chlorahexidine gluconate) soap before surgery.  CHG is an antiseptic cleaner which kills germs and bonds with the skin to continue killing germs even after washing. Please DO NOT use if you have an allergy to CHG or antibacterial soaps.  If your skin becomes reddened/irritated stop using the CHG and inform your nurse when you arrive at Short Stay. Do not shave (including legs and underarms) for at least 48 hours prior to the  first CHG shower.  You may shave your face/neck. Please follow these instructions carefully:  1.  Shower with CHG Soap the night before surgery and the  morning of Surgery.  2.  If you choose to wash your hair, wash your hair first as usual with your  normal  shampoo.  3.  After you shampoo, rinse your hair and body thoroughly to remove the  shampoo.                           4.  Use CHG as you would any other liquid soap.  You can apply chg directly  to the skin and wash                       Gently with a scrungie or clean washcloth.  5.  Apply the CHG  Soap to your body ONLY FROM THE NECK DOWN.   Do not use on face/ open                           Wound or open sores. Avoid contact with eyes, ears mouth and genitals (private parts).                       Wash face,  Genitals (private parts) with your normal soap.             6.  Wash thoroughly, paying special attention to the area where your surgery  will be performed.  7.  Thoroughly rinse your body with warm water from the neck down.  8.  DO NOT shower/wash with your normal soap after using and rinsing off  the CHG Soap.                9.  Pat yourself dry with a clean towel.            10.  Wear clean pajamas.            11.  Place clean sheets on your bed the night of your first shower and do not  sleep with pets. Day of Surgery : Do not apply any lotions/deodorants the morning of surgery.  Please wear clean clothes to the hospital/surgery center.  FAILURE TO FOLLOW THESE INSTRUCTIONS MAY RESULT IN THE CANCELLATION OF YOUR SURGERY PATIENT SIGNATURE_________________________________  NURSE SIGNATURE__________________________________  ________________________________________________________________________

## 2015-08-20 ENCOUNTER — Encounter (HOSPITAL_COMMUNITY): Payer: Self-pay

## 2015-08-20 ENCOUNTER — Encounter (HOSPITAL_COMMUNITY)
Admission: RE | Admit: 2015-08-20 | Discharge: 2015-08-20 | Disposition: A | Discharge status: Home / Self Care | Payer: Medicare Other | Source: Ambulatory Visit | Attending: General Surgery | Admitting: General Surgery

## 2015-08-20 DIAGNOSIS — K4091 Unilateral inguinal hernia, without obstruction or gangrene, recurrent: Secondary | ICD-10-CM | POA: Diagnosis not present

## 2015-08-20 DIAGNOSIS — Z01812 Encounter for preprocedural laboratory examination: Secondary | ICD-10-CM | POA: Insufficient documentation

## 2015-08-20 DIAGNOSIS — Z01818 Encounter for other preprocedural examination: Secondary | ICD-10-CM | POA: Diagnosis present

## 2015-08-20 HISTORY — DX: Chronic kidney disease, unspecified: N18.9

## 2015-08-20 LAB — COMPREHENSIVE METABOLIC PANEL
ALBUMIN: 4.6 g/dL (ref 3.5–5.0)
ALT: 19 U/L (ref 17–63)
AST: 22 U/L (ref 15–41)
Alkaline Phosphatase: 53 U/L (ref 38–126)
Anion gap: 8 (ref 5–15)
BUN: 16 mg/dL (ref 6–20)
CHLORIDE: 103 mmol/L (ref 101–111)
CO2: 27 mmol/L (ref 22–32)
CREATININE: 1.13 mg/dL (ref 0.61–1.24)
Calcium: 9.6 mg/dL (ref 8.9–10.3)
GFR calc Af Amer: >60 mL/min (ref >60)
GLUCOSE: 114 mg/dL — AB (ref 65–99)
Potassium: 4.1 mmol/L (ref 3.5–5.1)
SODIUM: 138 mmol/L (ref 135–145)
Total Bilirubin: 0.7 mg/dL (ref 0.3–1.2)
Total Protein: 7 g/dL (ref 6.5–8.1)

## 2015-08-20 LAB — CBC WITH DIFFERENTIAL/PLATELET
BASOS ABS: 0 10*3/uL (ref 0.0–0.1)
Basophils Relative: 0 %
EOS PCT: 3 %
Eosinophils Absolute: 0.2 10*3/uL (ref 0.0–0.7)
HCT: 40.2 % (ref 39.0–52.0)
Hemoglobin: 14.5 g/dL (ref 13.0–17.0)
LYMPHS PCT: 29 %
Lymphs Abs: 2 10*3/uL (ref 0.7–4.0)
MCH: 32.3 pg (ref 26.0–34.0)
MCHC: 36.1 g/dL — ABNORMAL HIGH (ref 30.0–36.0)
MCV: 89.5 fL (ref 78.0–100.0)
Monocytes Absolute: 0.6 10*3/uL (ref 0.1–1.0)
Monocytes Relative: 9 %
NEUTROS ABS: 4 10*3/uL (ref 1.7–7.7)
Neutrophils Relative %: 59 %
PLATELETS: 192 10*3/uL (ref 150–400)
RBC: 4.49 MIL/uL (ref 4.22–5.81)
RDW: 12.7 % (ref 11.5–15.5)
WBC: 6.9 10*3/uL (ref 4.0–10.5)

## 2015-08-21 NOTE — Progress Notes (Signed)
Final EKG -08/20/15- [facility]

## 2015-08-23 NOTE — H&P (Addendum)
Jeffrey Watkins. Birt Location: [facility] Patient #: [id number] DOB: 1940-09-15 Married / Language: English / Race: White Male       History of Present Illness  The patient is a 75 year old male who presents with a complaint of recurrent left inguinal hernia. This is a very pleasant, fairly healthy 75 year old gentleman. He is referred back to me by Jeffrey Watkins at [facility] primary care for a left groin bulge.  I performed a laparoscopic left inguinal hernia repair with mesh on Jul 11, 2012. Postop visit was unremarkable. He has felt fine. He does backpacking every year with his sons and has been able to do that. He's noticed a bulge. Not no real pain. Vague about whether is still uncomfortable. Always able to push it back in.  Comorbidities include hypertension. Prediabetes. BPH.  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  We talked a lot about the anatomy of the recurrent hernia. We discussed the natural history which is to progress at some rate. We talked about the technique of repair which would be an open anterior approach, either a Magazine features editor or a Cooper's ligament repair with mesh. We talked about temporary disabilities. We talked about risks and he understands all this. We went over patient information booklets with diagrams. He is going to go home and speak with his wife and think about this. He will call me back if he decides to go ahead and have this repaired.   Addendum Note Jeffrey Watkins called me today and wanted to go ahead with scheduling of open repair of his recurrent left inguinal hernia with mesh.  He wanted me to be aware that he had been evaluated for microscopic hematuria and had had a CT scan and an MRI. They had seen a mass in his liver but on MRI looked like a hemangioma. He also had a spot on his kidneys but these were felt to be hemorrhagic cyst. Sigmoid colon was noted in his recurrent left inguinal hernia.  He  stated that his PCP, Jeffrey Watkins, told him to go ahead and that it was safe to proceed with hernia surgery.  We talked for a while. I told him I would make an incision and repair the hernia with mesh. I might do this as a Magazine features editor or if I am uncertain about the femoral space on might do a Cooper's ligament repair. We talked about the types of mesh since he had done some research on this and at the end of the conversation the patient is comfortable with everything. He wanted to get this done before June 20 because he has a big vacation at the end of July. We are going to set this up to get this done according to his schedule.   Other Problems  Diverticulosis High blood pressure Inguinal Hernia Kidney Stone Melanoma  Past Surgical History  Laparoscopic Inguinal Hernia Surgery Left.  Diagnostic Studies History  Colonoscopy 5-10 years ago  Allergies  Statins Support *DIETARY PRODUCTS/DIETARY MANAGEMENT PRODUCTS* Codeine Sulfate *ANALGESICS - OPIOID* Simvastatin *CHEMICALS*  Medication History  Losartan Potassium (50MG  Tablet, Oral) Active. Omega 3 (550MG  Capsule, Oral) Active. Pantethine (300MG  Capsule, Oral TWo times daily) Active. Multiple Vitamins (Oral) Active. Vitamin C (500MG  Tablet, Oral) Active. Vitamin B-Complex (Oral) Active. Grape Seed (250-50MG  Capsule, Oral) Active. CoQ-10 (100MG  Capsule, Oral) Active. Aspirin (81MG  Tablet, Oral) Active. Align (Oral) Active. Lycopene (15MG  Capsule, Oral) Active. Prostate Health (Oral) Active. Tocotrienols (50% Suspension,) Active. Medications Reconciled  Social History Alcohol use Occasional alcohol use. Caffeine use Coffee. No drug use Tobacco use Never smoker.  Family History  Hypertension Father.    Review of Systems  General Not Present- Appetite Loss, Chills, Fatigue, Fever, Night Sweats, Weight Gain and Weight Loss. Skin Not Present- Change in Wart/Mole, Dryness,  Hives, Jaundice, New Lesions, Non-Healing Wounds, Rash and Ulcer. HEENT Present- Wears glasses/contact lenses. Not Present- Earache, Hearing Loss, Hoarseness, Nose Bleed, Oral Ulcers, Ringing in the Ears, Seasonal Allergies, Sinus Pain, Sore Throat, Visual Disturbances and Yellow Eyes. Respiratory Not Present- Bloody sputum, Chronic Cough, Difficulty Breathing, Snoring and Wheezing. Cardiovascular Present- Leg Cramps. Not Present- Chest Pain, Difficulty Breathing Lying Down, Palpitations, Rapid Heart Rate, Shortness of Breath and Swelling of Extremities. Gastrointestinal Not Present- Abdominal Pain, Bloating, Bloody Stool, Change in Bowel Habits, Chronic diarrhea, Constipation, Difficulty Swallowing, Excessive gas, Gets full quickly at meals, Hemorrhoids, Indigestion, Nausea, Rectal Pain and Vomiting. Male Genitourinary Present- Blood in Urine. Not Present- Change in Urinary Stream, Frequency, Impotence, Nocturia, Painful Urination, Urgency and Urine Leakage. Musculoskeletal Not Present- Back Pain, Joint Pain, Joint Stiffness, Muscle Pain, Muscle Weakness and Swelling of Extremities. Neurological Not Present- Decreased Memory, Fainting, Headaches, Numbness, Seizures, Tingling, Tremor, Trouble walking and Weakness. Psychiatric Not Present- Anxiety, Bipolar, Change in Sleep Pattern, Depression, Fearful and Frequent crying. Endocrine Not Present- Cold Intolerance, Excessive Hunger, Hair Changes, Heat Intolerance, Hot flashes and New Diabetes. Hematology Not Present- Easy Bruising, Excessive bleeding, Gland problems, HIV and Persistent Infections.  Vitals  Weight: 202 lb Height: 70in Body Surface Area: 2.1 m Body Mass Index: 28.98 kg/m  Temp.: 63F  Pulse: 81 (Regular)  BP: 130/70 (Sitting, Left Arm, Standard)       Physical Exam  General Mental Status-Alert. General Appearance-Consistent with stated age. Hydration-Well hydrated. Voice-Normal.  Head and  Neck Head-normocephalic, atraumatic with no lesions or palpable masses. Trachea-midline. Thyroid Gland Characteristics - normal size and consistency.  Eye Eyeball - Bilateral-Extraocular movements intact. Sclera/Conjunctiva - Bilateral-No scleral icterus.  Chest and Lung Exam Chest and lung exam reveals -quiet, even and easy respiratory effort with no use of accessory muscles and on auscultation, normal breath sounds, no adventitious sounds and normal vocal resonance. Inspection Chest Wall - Normal. Back - normal.  Breast Breast - Left-Symmetric, Non Tender, No Biopsy scars, no Dimpling, No Inflammation, No Lumpectomy scars, No Mastectomy scars, No Peau d' Orange. Breast - Right-Symmetric, Non Tender, No Biopsy scars, no Dimpling, No Inflammation, No Lumpectomy scars, No Mastectomy scars, No Peau d' Orange. Breast Lump-No Palpable Breast Mass.  Cardiovascular Cardiovascular examination reveals -normal heart sounds, regular rate and rhythm with no murmurs and normal pedal pulses bilaterally.  Abdomen Note: Soft and nontender. Transverse incision below umbilicus well-healed. Trocar sites well-healed. No organomegaly. No mass.   Male Genitourinary Note: Large left inguinal hernia. Reducible when supine. Almost goes into the scrotum. I wonder about an indirect recurrence, but I'm not sure. Both testes descended. No scrotal mass when supine. No evidence of hernia on the right side. When standing the hernia bulge is the size of a small grapefruit.   Neurologic Neurologic evaluation reveals -alert and oriented x 3 with no impairment of recent or remote memory. Mental Status-Normal.  Musculoskeletal Normal Exam - Left-Upper Extremity Strength Normal and Lower Extremity Strength Normal. Normal Exam - Right-Upper Extremity Strength Normal and Lower Extremity Strength Normal.  Lymphatic Head & Neck  General Head & Neck Lymphatics: Bilateral - Description -  Normal. Axillary  General Axillary Region: Bilateral - Description -  Normal. Tenderness - Non Tender. Femoral & Inguinal  Generalized Femoral & Inguinal Lymphatics: Bilateral - Description - Normal. Tenderness - Non Tender.    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 HYPERTENSION, BENIGN (I10) PREDIABETES - Erroneous Entry (R73.03) Impression: A1c 6. BPH WITHOUT URINARY OBSTRUCTION (N40.0)   Jeffrey Watkins. Dalbert Batman, M.D., [facility] General and Minimally invasive Surgery Breast and Colorectal Surgery Office:   [phone] Pager:   [phone]

## 2015-08-26 ENCOUNTER — Ambulatory Visit (HOSPITAL_COMMUNITY)
Admission: RE | Admit: 2015-08-26 | Discharge: 2015-08-26 | Disposition: A | Discharge status: Home / Self Care | Payer: Medicare Other | Source: Ambulatory Visit | Attending: General Surgery | Admitting: General Surgery

## 2015-08-26 ENCOUNTER — Encounter (HOSPITAL_COMMUNITY)
Admission: RE | Disposition: A | Discharge status: Home / Self Care | Payer: Self-pay | Source: Ambulatory Visit | Attending: General Surgery

## 2015-08-26 ENCOUNTER — Ambulatory Visit (HOSPITAL_COMMUNITY): Payer: Medicare Other | Admitting: Registered Nurse

## 2015-08-26 ENCOUNTER — Encounter (HOSPITAL_COMMUNITY): Payer: Self-pay | Admitting: *Deleted

## 2015-08-26 DIAGNOSIS — I10 Essential (primary) hypertension: Secondary | ICD-10-CM | POA: Insufficient documentation

## 2015-08-26 DIAGNOSIS — N400 Enlarged prostate without lower urinary tract symptoms: Secondary | ICD-10-CM | POA: Insufficient documentation

## 2015-08-26 DIAGNOSIS — Z79899 Other long term (current) drug therapy: Secondary | ICD-10-CM | POA: Insufficient documentation

## 2015-08-26 DIAGNOSIS — K4091 Unilateral inguinal hernia, without obstruction or gangrene, recurrent: Secondary | ICD-10-CM | POA: Diagnosis present

## 2015-08-26 DIAGNOSIS — Z85820 Personal history of malignant melanoma of skin: Secondary | ICD-10-CM | POA: Insufficient documentation

## 2015-08-26 HISTORY — PX: INGUINAL HERNIA REPAIR: SHX194

## 2015-08-26 SURGERY — REPAIR, HERNIA, INGUINAL, ADULT
Anesthesia: General | Site: Groin | Laterality: Left

## 2015-08-26 MED ORDER — LIDOCAINE HCL (CARDIAC) 20 MG/ML IV SOLN
INTRAVENOUS | Status: DC | PRN
  Administered 2015-08-26: 100 mg via INTRAVENOUS

## 2015-08-26 MED ORDER — SODIUM CHLORIDE 0.9 % IV SOLN: 250.0000 mL | INTRAVENOUS | Status: DC | PRN

## 2015-08-26 MED ORDER — ONDANSETRON HCL 4 MG/2ML IJ SOLN
INTRAMUSCULAR | Status: AC
Start: 2015-08-26 — End: 2015-08-26
  Filled 2015-08-26: qty 2

## 2015-08-26 MED ORDER — MIDAZOLAM HCL 2 MG/2ML IJ SOLN: 0.5000 mg | Freq: Once | INTRAMUSCULAR | Status: DC | PRN

## 2015-08-26 MED ORDER — CEFAZOLIN SODIUM-DEXTROSE 2-4 GM/100ML-% IV SOLN
2.0000 g | INTRAVENOUS | Status: AC
  Administered 2015-08-26: 2 g via INTRAVENOUS
  Filled 2015-08-26: qty 100

## 2015-08-26 MED ORDER — LIDOCAINE HCL (CARDIAC) 20 MG/ML IV SOLN
INTRAVENOUS | Status: AC
  Filled 2015-08-26: qty 5

## 2015-08-26 MED ORDER — PROPOFOL 10 MG/ML IV BOLUS
INTRAVENOUS | Status: DC | PRN
  Administered 2015-08-26: 180 mg via INTRAVENOUS

## 2015-08-26 MED ORDER — MIDAZOLAM HCL 2 MG/2ML IJ SOLN
INTRAMUSCULAR | Status: AC
  Filled 2015-08-26: qty 2

## 2015-08-26 MED ORDER — ACETAMINOPHEN 325 MG PO TABS: 650.0000 mg | ORAL_TABLET | ORAL | Status: DC | PRN

## 2015-08-26 MED ORDER — FENTANYL CITRATE (PF) 250 MCG/5ML IJ SOLN
INTRAMUSCULAR | Status: AC
  Filled 2015-08-26: qty 5

## 2015-08-26 MED ORDER — SODIUM CHLORIDE 0.9 % IV SOLN: INTRAVENOUS | Status: DC

## 2015-08-26 MED ORDER — SODIUM CHLORIDE 0.9% FLUSH: 3.0000 mL | INTRAVENOUS | Status: DC | PRN

## 2015-08-26 MED ORDER — OXYCODONE HCL 5 MG PO TABS: 5.0000 mg | ORAL_TABLET | ORAL | Status: DC | PRN

## 2015-08-26 MED ORDER — FENTANYL CITRATE (PF) 100 MCG/2ML IJ SOLN
INTRAMUSCULAR | Status: DC | PRN
  Administered 2015-08-26 (×3): 50 ug via INTRAVENOUS

## 2015-08-26 MED ORDER — HYDROMORPHONE HCL 1 MG/ML IJ SOLN
INTRAMUSCULAR | Status: AC
  Administered 2015-08-26: 0.25 mg via INTRAVENOUS
  Filled 2015-08-26: qty 1

## 2015-08-26 MED ORDER — SODIUM CHLORIDE 0.9 % IJ SOLN
INTRAMUSCULAR | Status: AC
  Filled 2015-08-26: qty 10

## 2015-08-26 MED ORDER — LACTATED RINGERS IV SOLN
INTRAVENOUS | Status: DC | PRN
  Administered 2015-08-26: 07:00:00 via INTRAVENOUS

## 2015-08-26 MED ORDER — SODIUM CHLORIDE 0.9% FLUSH: 3.0000 mL | Freq: Two times a day (BID) | INTRAVENOUS | Status: DC

## 2015-08-26 MED ORDER — SUGAMMADEX SODIUM 200 MG/2ML IV SOLN
INTRAVENOUS | Status: AC
  Filled 2015-08-26: qty 2

## 2015-08-26 MED ORDER — CHLORHEXIDINE GLUCONATE 4 % EX LIQD: 1.0000 "application " | Freq: Once | CUTANEOUS | Status: DC

## 2015-08-26 MED ORDER — LIDOCAINE-EPINEPHRINE 1 %-1:100000 IJ SOLN
INTRAMUSCULAR | Status: AC
  Filled 2015-08-26: qty 1

## 2015-08-26 MED ORDER — EPHEDRINE SULFATE 50 MG/ML IJ SOLN
INTRAMUSCULAR | Status: DC | PRN
  Administered 2015-08-26: 5 mg via INTRAVENOUS

## 2015-08-26 MED ORDER — PROMETHAZINE HCL 25 MG/ML IJ SOLN: 6.2500 mg | INTRAMUSCULAR | Status: DC | PRN

## 2015-08-26 MED ORDER — ROCURONIUM BROMIDE 100 MG/10ML IV SOLN
INTRAVENOUS | Status: DC | PRN
  Administered 2015-08-26: 10 mg via INTRAVENOUS
  Administered 2015-08-26: 30 mg via INTRAVENOUS

## 2015-08-26 MED ORDER — MIDAZOLAM HCL 5 MG/5ML IJ SOLN
INTRAMUSCULAR | Status: DC | PRN
  Administered 2015-08-26: 2 mg via INTRAVENOUS

## 2015-08-26 MED ORDER — PROPOFOL 10 MG/ML IV BOLUS
INTRAVENOUS | Status: AC
  Filled 2015-08-26: qty 20

## 2015-08-26 MED ORDER — ACETAMINOPHEN 650 MG RE SUPP
650.0000 mg | RECTAL | Status: DC | PRN
  Filled 2015-08-26: qty 1

## 2015-08-26 MED ORDER — ONDANSETRON HCL 4 MG/2ML IJ SOLN
INTRAMUSCULAR | Status: DC | PRN
  Administered 2015-08-26: 4 mg via INTRAVENOUS

## 2015-08-26 MED ORDER — FENTANYL CITRATE (PF) 100 MCG/2ML IJ SOLN: 25.0000 ug | INTRAMUSCULAR | Status: DC | PRN

## 2015-08-26 MED ORDER — OXYCODONE-ACETAMINOPHEN 7.5-325 MG PO TABS: 1.0000 | ORAL_TABLET | ORAL | Status: DC | PRN

## 2015-08-26 MED ORDER — BUPIVACAINE-EPINEPHRINE 0.5% -1:200000 IJ SOLN
INTRAMUSCULAR | Status: DC | PRN
  Administered 2015-08-26: 7 mL

## 2015-08-26 MED ORDER — 0.9 % SODIUM CHLORIDE (POUR BTL) OPTIME
TOPICAL | Status: DC | PRN
  Administered 2015-08-26: 1000 mL

## 2015-08-26 MED ORDER — MEPERIDINE HCL 50 MG/ML IJ SOLN: 6.2500 mg | INTRAMUSCULAR | Status: DC | PRN

## 2015-08-26 MED ORDER — HYDROMORPHONE HCL 1 MG/ML IJ SOLN
0.2500 mg | INTRAMUSCULAR | Status: DC | PRN
  Administered 2015-08-26 (×2): 0.25 mg via INTRAVENOUS

## 2015-08-26 MED ORDER — EPHEDRINE SULFATE 50 MG/ML IJ SOLN
INTRAMUSCULAR | Status: AC
  Filled 2015-08-26: qty 1

## 2015-08-26 MED ORDER — ROCURONIUM BROMIDE 100 MG/10ML IV SOLN
INTRAVENOUS | Status: AC
  Filled 2015-08-26: qty 1

## 2015-08-26 MED ORDER — CEFAZOLIN SODIUM-DEXTROSE 2-4 GM/100ML-% IV SOLN
INTRAVENOUS | Status: AC
  Filled 2015-08-26: qty 100

## 2015-08-26 MED ORDER — SUCCINYLCHOLINE CHLORIDE 20 MG/ML IJ SOLN
INTRAMUSCULAR | Status: DC | PRN
  Administered 2015-08-26: 100 mg via INTRAVENOUS

## 2015-08-26 MED ORDER — BUPIVACAINE-EPINEPHRINE (PF) 0.5% -1:200000 IJ SOLN
INTRAMUSCULAR | Status: AC
  Filled 2015-08-26: qty 30

## 2015-08-26 MED ORDER — SUGAMMADEX SODIUM 200 MG/2ML IV SOLN
INTRAVENOUS | Status: DC | PRN
  Administered 2015-08-26: 180 mg via INTRAVENOUS

## 2015-08-26 SURGICAL SUPPLY — 46 items
BENZOIN TINCTURE PRP APPL 2/3 (GAUZE/BANDAGES/DRESSINGS) IMPLANT
BLADE HEX COATED 2.75 (ELECTRODE) ×3 IMPLANT
BLADE SURG 15 STRL LF DISP TIS (BLADE) ×1 IMPLANT
BLADE SURG 15 STRL SS (BLADE) ×2
CHLORAPREP W/TINT 26ML (MISCELLANEOUS) ×3 IMPLANT
CLOSURE WOUND 1/2 X4 (GAUZE/BANDAGES/DRESSINGS)
COVER SURGICAL LIGHT HANDLE (MISCELLANEOUS) ×3 IMPLANT
DECANTER SPIKE VIAL GLASS SM (MISCELLANEOUS) ×3 IMPLANT
DERMABOND ADVANCED (GAUZE/BANDAGES/DRESSINGS) ×2
DERMABOND ADVANCED .7 DNX12 (GAUZE/BANDAGES/DRESSINGS) ×1 IMPLANT
DISSECTOR ROUND CHERRY 3/8 STR (MISCELLANEOUS) IMPLANT
DRAIN PENROSE 18X1/2 LTX STRL (DRAIN) IMPLANT
DRAPE LAPAROTOMY TRNSV 102X78 (DRAPE) ×3 IMPLANT
ELECT PENCIL ROCKER SW 15FT (MISCELLANEOUS) ×3 IMPLANT
ELECT REM PT RETURN 9FT ADLT (ELECTROSURGICAL) ×3
ELECTRODE REM PT RTRN 9FT ADLT (ELECTROSURGICAL) ×1 IMPLANT
GAUZE SPONGE 4X4 12PLY STRL (GAUZE/BANDAGES/DRESSINGS) IMPLANT
GLOVE EUDERMIC 7 POWDERFREE (GLOVE) ×3 IMPLANT
GOWN STRL REUS W/TWL XL LVL3 (GOWN DISPOSABLE) ×6 IMPLANT
KIT BASIN OR (CUSTOM PROCEDURE TRAY) ×3 IMPLANT
LIQUID BAND (GAUZE/BANDAGES/DRESSINGS) IMPLANT
MESH ULTRAPRO 3X6 7.6X15CM (Mesh General) ×3 IMPLANT
NEEDLE HYPO 25X1 1.5 SAFETY (NEEDLE) ×3 IMPLANT
PACK BASIC VI WITH GOWN DISP (CUSTOM PROCEDURE TRAY) ×3 IMPLANT
SPONGE LAP 4X18 X RAY DECT (DISPOSABLE) ×3 IMPLANT
STAPLER VISISTAT 35W (STAPLE) IMPLANT
STRIP CLOSURE SKIN 1/2X4 (GAUZE/BANDAGES/DRESSINGS) IMPLANT
SUT MNCRL AB 4-0 PS2 18 (SUTURE) ×3 IMPLANT
SUT PROLENE 2 0 CT2 30 (SUTURE) ×6 IMPLANT
SUT SILK 2 0 (SUTURE) ×2
SUT SILK 2 0 SH (SUTURE) ×3 IMPLANT
SUT SILK 2-0 18XBRD TIE 12 (SUTURE) ×1 IMPLANT
SUT VIC AB 2-0 SH 27 (SUTURE) ×4
SUT VIC AB 2-0 SH 27X BRD (SUTURE) ×2 IMPLANT
SUT VIC AB 3-0 54XBRD REEL (SUTURE) ×1 IMPLANT
SUT VIC AB 3-0 BRD 54 (SUTURE) ×2
SUT VIC AB 3-0 SH 27 (SUTURE) ×2
SUT VIC AB 3-0 SH 27XBRD (SUTURE) ×1 IMPLANT
SUT VICRYL 2 0 18  UND BR (SUTURE)
SUT VICRYL 2 0 18 UND BR (SUTURE) IMPLANT
SYR 20CC LL (SYRINGE) ×3 IMPLANT
SYR BULB IRRIGATION 50ML (SYRINGE) ×3 IMPLANT
TOWEL OR 17X26 10 PK STRL BLUE (TOWEL DISPOSABLE) ×3 IMPLANT
TOWEL OR NON WOVEN STRL DISP B (DISPOSABLE) ×3 IMPLANT
TRAY FOLEY W/METER SILVER 16FR (SET/KITS/TRAYS/PACK) IMPLANT
YANKAUER SUCT BULB TIP 10FT TU (MISCELLANEOUS) IMPLANT

## 2015-08-26 NOTE — Interval H&P Note (Signed)
History and Physical Interval Note:  08/26/2015 7:02 AM  Jeffrey Watkins  has presented today for surgery, with the diagnosis of Recurrent left inguinal hernia  The various methods of treatment have been discussed with the patient and family. After consideration of risks, benefits and other options for treatment, the patient has consented to  Procedure(s): OPEN REPAIR RECURRENT LEFT INGUINAL HERNIA WITH MESH (Left) as a surgical intervention .  The patient's history has been reviewed, patient examined, no change in status, stable for surgery.  I have reviewed the patient's chart and labs.  Questions were answered to the patient's satisfaction.     Adin Hector

## 2015-08-26 NOTE — Op Note (Signed)
Patient Name:           Jeffrey Watkins   Date of Surgery:        08/26/2015  Pre op Diagnosis:      Recurrent left inguinal hernia    Post op Diagnosis:    Recurrent left inguinal hernia, indirect sliding type  Procedure:                 Open repair recurrent left inguinal hernia with meshKarl Pock repair)  Surgeon:                     Edsel Petrin. Dalbert Batman, M.D., FACS  Assistant:                      OR staff  Operative Indications:    This is a very pleasant, fairly healthy 75 year old gentleman. He is referred back to me by Dr. Janith Lima at [facility] primary care for a left groin bulge.      I performed a laparoscopic left inguinal hernia repair with mesh on Jul 11, 2012. Postop visit was unremarkable. He has felt fine. He does backpacking every year with his sons and has been able to do that. He's noticed a bulge. Not no real pain. Vague about whether is still uncomfortable. Always able to push it back in.    On exam he has a large left angle hernia, reducible when supine.  Almost goes into the scrotum.  Possible indirect recurrence.  Cannot rule out femoral recurrence.        We talked a lot about the anatomy of the recurrent hernia.  He is brought to the operating room electively  Operative Findings:       The patient had a large indirect, sliding left inguinal hernia.  The left colon was part of the wall of the sac and I was able to dissected this away from the sac adequately to reduce it within the abdominal cavity.  I was able to pass my finger through the opened hernia sac and feel the femoral artery and left pubic rami and femoral space.  I was able to confirm that there was no evidence of femoral hernia.  Procedure in Detail:          Following the induction of general endotracheal anesthesia the patient's abdomen and genitalia and left groin were prepped and draped in a sterile fashion.  Intravenous antibiotics were given.  Surgical timeout was performed.  0.5% Marcaine  with epinephrine was used as a local infiltration anesthetic.     A transverse incision was made in the left inguinal area overlying the left inguinal canal.  Dissection was carried down to the external oblique.  The external oblique was cleaned off and incised in the direction of its fibers, carefully opening up the external inguinal ring.  The external oblique was dissected away from the underlying structures to create a large platform for the repair and self-retaining retractors were placed.  Ilioinguinal nerve was densely scarred to the hernia sac and the cord structures.  It was dissected back to its emergence from the muscle laterally, clamped, divided, and ligated with 2-0 silk.  The redundant nerve was discarded.      The cord structures were mobilized and encircled with a Penrose drain.  I dissected the cremasteric muscle fibers off.  I found a very large indirect sac was carefully dissected away from the testicular vessels and vas deferens all the way  back to the level of the internal ring.  Sac was opened and inspected with findings as described above.  Using Traction and countertraction I carefully dissected the sigmoid colon away from the wall of the sac and reduced it into the abdominal cavity.  The hernia sac was then closed under direct vision with a pursestring suture of 2-0 Vicryl.  The excess redundant sac was excised and discarded.  The internal inguinal ring was closed laterally with some figure-of-eight sutures of 2-0 Vicryl.  This held the reduction in place.  There was no evidence of any other hernia as described above.  Hemostasis was adequate and the wound was irrigated with saline.  The floor of the inguinal canal was repaired and reinforced with a 3" x 6" piece of ultra Pro mesh.  The mesh was brought to the operative field and most of the mesh was used but it was trimmed at the corners to accommodate the anatomy of the wound.  The mesh was sutured in place with running sutures of 2-0  Prolene and interrupted mattress sutures of 2-0 Prolene.  The mesh was sutured so as to generously overlap the fascia at the pubic tubercle medially, then along the inguinal ligament inferiorly.  Medially, superiorly, and superior laterally several interrupted mattress sutures of Prolene were placed.  The mesh was incised laterally so as to wraparound the cord structures at the internal ring.  The tails of the mesh were overlapped laterally and further sutures were placed laterally.  This provided very secure repair both medial and lateral to the internal ring but allowed an adequate fingertip opening for the cord structures.  The wound was irrigated with saline.  Hemostasis was good.  The external oblique was closed with a running suture of 2-0 Vicryl placing the cord structures deep to the external oblique.  Scarpa's fascia was closed with 3-0 Vicryl and skin closed with a running subcuticular 4-0 Monocryl and Dermabond.  The patient tolerated the procedure well was taken to PACU in stable condition.  EBL 20 mL or less.  Counts correct.  Complications none.     Edsel Petrin. Dalbert Batman, M.D., FACS General and Minimally Invasive Surgery Breast and Colorectal Surgery  08/26/2015 8:44 AM

## 2015-08-26 NOTE — Discharge Instructions (Signed)
[facility] _______[facility]  UMBILICAL OR INGUINAL HERNIA REPAIR: POST OP INSTRUCTIONS  Always review your discharge instruction sheet given to you by the facility where your surgery was performed. IF YOU HAVE DISABILITY OR FAMILY LEAVE FORMS, YOU MUST BRING THEM TO THE OFFICE FOR PROCESSING.   DO NOT GIVE THEM TO YOUR DOCTOR.  1. A  prescription for pain medication may be given to you upon discharge.  Take your pain medication as prescribed, if needed.  If narcotic pain medicine is not needed, then you may take acetaminophen (Tylenol) or ibuprofen (Advil) as needed. 2. Take your usually prescribed medications unless otherwise directed. 3. If you need a refill on your pain medication, please contact your pharmacy.  They will contact our office to request authorization. Prescriptions will not be filled after 5 pm or on week-ends. 4. You should follow a light diet the first 24 hours after arrival home, such as soup and crackers, etc.  Be sure to include lots of fluids daily.  Resume your normal diet the day after surgery. 5. Most patients will experience some swelling and bruising around the umbilicus or in the groin and scrotum.  Ice packs and reclining will help.  Swelling and bruising can take several days to resolve.  6. It is common to experience some constipation if taking pain medication after surgery.  Increasing fluid intake and taking a stool softener (such as Colace) will usually help or prevent this problem from occurring.  A mild laxative (Milk of Magnesia or Miralax) should be taken according to package directions if there are no bowel movements after 48 hours. 7. Unless discharge instructions indicate otherwise, you may remove your bandages 24-48 hours after surgery, and you may shower at that time.  You may have steri-strips (small skin tapes) in place directly over the incision.  These strips should be left on the skin for 7-10 days.  If your surgeon used skin glue on the  incision, you may shower in 24 hours.  The glue will flake off over the next 2-3 weeks.  Any sutures or staples will be removed at the office during your follow-up visit. 8. ACTIVITIES:  You may resume regular (light) daily activities beginning the next day--such as daily self-care, walking, climbing stairs--gradually increasing activities as tolerated.  You may have sexual intercourse when it is comfortable.  Refrain from any heavy lifting or straining until approved by your doctor. a. You may drive when you are no longer taking prescription pain medication, you can comfortably wear a seatbelt, and you can safely maneuver your car and apply brakes. b. RETURN TO WORK:  __________________________________________________________ 9. You should see your doctor in the office for a follow-up appointment approximately 2-3 weeks after your surgery.  Make sure that you call for this appointment within a day or two after you arrive home to insure a convenient appointment time. 10. OTHER INSTRUCTIONS:  __________________________________________________________________________________________________________________________________________________________________________________________  WHEN TO CALL YOUR DOCTOR: 1. Fever over 101.0 2. Inability to urinate 3. Nausea and/or vomiting 4. Extreme swelling or bruising 5. Continued bleeding from incision. 6. Increased pain, redness, or drainage from the incision  The clinic staff is available to answer your questions during regular business hours.  Please don't hesitate to call and ask to speak to one of the nurses for clinical concerns.  If you have a medical emergency, go to the nearest emergency room or call 911.  A surgeon from [facility] is always on call at the hospital     [address], [address], [place], [place]  [address] ?  [address] [address], [place],    [address] [phone] ? [phone] ? FAX [phone] Web site:  [email or web address]  

## 2015-08-26 NOTE — Anesthesia Postprocedure Evaluation (Signed)
Anesthesia Post Note  Patient: Jeffrey Watkins  Procedure(s) Performed: Procedure(s) (LRB): OPEN REPAIR RECURRENT LEFT INGUINAL HERNIA WITH MESH (Left)  Patient location during evaluation: PACU Anesthesia Type: General Level of consciousness: awake and alert, oriented and patient cooperative Pain management: pain level controlled Vital Signs Assessment: post-procedure vital signs reviewed and stable Respiratory status: spontaneous breathing, nonlabored ventilation and respiratory function stable Cardiovascular status: blood pressure returned to baseline and stable Postop Assessment: no signs of nausea or vomiting Anesthetic complications: no    Last Vitals:  Filed Vitals:   08/26/15 0915 08/26/15 0930  BP: 149/75 148/86  Pulse: 80 78  Temp:  36.9 C  Resp: 12 15    Last Pain:  Filed Vitals:   08/26/15 0936  PainSc: 2                  Taiwo Fish,E. Jonael Paradiso

## 2015-08-26 NOTE — Transfer of Care (Signed)
Immediate Anesthesia Transfer of Care Note  Patient: Jeffrey Watkins  Procedure(s) Performed: Procedure(s): OPEN REPAIR RECURRENT LEFT INGUINAL HERNIA WITH MESH (Left)  Patient Location: PACU  Anesthesia Type:General  Level of Consciousness: awake, alert , oriented and patient cooperative  Airway & Oxygen Therapy: Patient Spontanous Breathing and Patient connected to face mask oxygen  Post-op Assessment: Report given to RN, Post -op Vital signs reviewed and stable and Patient moving all extremities  Post vital signs: Reviewed and stable  Last Vitals:  Filed Vitals:   08/26/15 0541  BP: 152/84  Pulse: 96  Temp: 36.7 C  Resp: 18    Last Pain: There were no vitals filed for this visit.    Patients Stated Pain Goal: 3 ([id number])  Complications: No apparent anesthesia complications

## 2015-08-26 NOTE — Anesthesia Procedure Notes (Signed)
Procedure Name: Intubation Date/Time: 08/26/2015 7:20 AM Performed by: Carleene Cooper A Pre-anesthesia Checklist: Patient identified, Timeout performed, Emergency Drugs available, Suction available and Patient being monitored Patient Re-evaluated:Patient Re-evaluated prior to inductionOxygen Delivery Method: Circle system utilized Preoxygenation: Pre-oxygenation with 100% oxygen Intubation Type: IV induction Ventilation: Mask ventilation without difficulty Laryngoscope Size: Mac and 4 Grade View: Grade I Tube type: Oral Tube size: 7.5 mm Number of attempts: 1 Airway Equipment and Method: Stylet Placement Confirmation: breath sounds checked- equal and bilateral,  ETT inserted through vocal cords under direct vision and positive ETCO2 Secured at: 21 cm Tube secured with: Tape Dental Injury: Teeth and Oropharynx as per pre-operative assessment

## 2015-08-26 NOTE — Anesthesia Preprocedure Evaluation (Addendum)
Anesthesia Evaluation  Patient identified by MRN, date of birth, ID band Patient awake    Reviewed: Allergy & Precautions, NPO status , Patient's Chart, lab work & pertinent test results  History of Anesthesia Complications Negative for: history of anesthetic complications  Airway Mallampati: I  TM Distance: >3 FB Neck ROM: Full    Dental  (+) Caps, Missing, Dental Advisory Given   Pulmonary neg pulmonary ROS,    breath sounds clear to auscultation       Cardiovascular hypertension, Pt. on medications (-) angina Rhythm:Regular Rate:Normal     Neuro/Psych negative neurological ROS     GI/Hepatic negative GI ROS, Neg liver ROS,   Endo/Other  negative endocrine ROS  Renal/GU negative Renal ROS     Musculoskeletal   Abdominal   Peds  Hematology negative hematology ROS (+)   Anesthesia Other Findings   Reproductive/Obstetrics                          Anesthesia Physical Anesthesia Plan  ASA: II  Anesthesia Plan: General   Post-op Pain Management:    Induction: Intravenous  Airway Management Planned: Oral ETT  Additional Equipment:   Intra-op Plan:   Post-operative Plan: Extubation in OR  Informed Consent: I have reviewed the patients History and Physical, chart, labs and discussed the procedure including the risks, benefits and alternatives for the proposed anesthesia with the patient or authorized representative who has indicated his/her understanding and acceptance.   Dental advisory given  Plan Discussed with: CRNA and Surgeon  Anesthesia Plan Comments: (Plan routine monitors, GETA)        Anesthesia Quick Evaluation

## 2015-08-27 ENCOUNTER — Encounter (HOSPITAL_COMMUNITY): Payer: Self-pay | Admitting: General Surgery

## 2015-10-17 ENCOUNTER — Encounter: Payer: Self-pay | Admitting: Gastroenterology

## 2015-11-19 ENCOUNTER — Ambulatory Visit (INDEPENDENT_AMBULATORY_CARE_PROVIDER_SITE_OTHER): Payer: Medicare Other

## 2015-11-19 DIAGNOSIS — Z23 Encounter for immunization: Secondary | ICD-10-CM

## 2016-01-13 ENCOUNTER — Other Ambulatory Visit (INDEPENDENT_AMBULATORY_CARE_PROVIDER_SITE_OTHER): Payer: Medicare Other

## 2016-01-13 ENCOUNTER — Ambulatory Visit (INDEPENDENT_AMBULATORY_CARE_PROVIDER_SITE_OTHER): Payer: Medicare Other | Admitting: Internal Medicine

## 2016-01-13 ENCOUNTER — Encounter: Payer: Self-pay | Admitting: Internal Medicine

## 2016-01-13 VITALS — BP 124/76 | HR 94 | Temp 98.6°F | Resp 16 | Ht 71.0 in | Wt 200.8 lb

## 2016-01-13 DIAGNOSIS — E790 Hyperuricemia without signs of inflammatory arthritis and tophaceous disease: Secondary | ICD-10-CM | POA: Diagnosis not present

## 2016-01-13 DIAGNOSIS — R160 Hepatomegaly, not elsewhere classified: Secondary | ICD-10-CM | POA: Diagnosis not present

## 2016-01-13 DIAGNOSIS — N2889 Other specified disorders of kidney and ureter: Secondary | ICD-10-CM | POA: Diagnosis not present

## 2016-01-13 DIAGNOSIS — I10 Essential (primary) hypertension: Secondary | ICD-10-CM | POA: Diagnosis not present

## 2016-01-13 DIAGNOSIS — R739 Hyperglycemia, unspecified: Secondary | ICD-10-CM

## 2016-01-13 DIAGNOSIS — R3129 Other microscopic hematuria: Secondary | ICD-10-CM | POA: Diagnosis not present

## 2016-01-13 LAB — BASIC METABOLIC PANEL
BUN: 14 mg/dL (ref 6–23)
CHLORIDE: 102 meq/L (ref 96–112)
CO2: 31 meq/L (ref 19–32)
Calcium: 10.4 mg/dL (ref 8.4–10.5)
Creatinine, Ser: 1.18 mg/dL (ref 0.40–1.50)
GFR: 63.9 mL/min (ref >60.00)
GLUCOSE: 108 mg/dL — AB (ref 70–99)
Potassium: 4.7 mEq/L (ref 3.5–5.1)
SODIUM: 140 meq/L (ref 135–145)

## 2016-01-13 LAB — URINALYSIS, ROUTINE W REFLEX MICROSCOPIC
BILIRUBIN URINE: NEGATIVE
KETONES UR: NEGATIVE
LEUKOCYTES UA: NEGATIVE
NITRITE: NEGATIVE
Specific Gravity, Urine: 1.01 (ref 1.000–1.030)
Total Protein, Urine: NEGATIVE
UROBILINOGEN UA: 0.2 (ref 0.0–1.0)
Urine Glucose: NEGATIVE
WBC UA: NONE SEEN (ref 0–?)
pH: 6 (ref 5.0–8.0)

## 2016-01-13 LAB — URIC ACID: Uric Acid, Serum: 7.3 mg/dL (ref 4.0–7.8)

## 2016-01-13 MED ORDER — ZOSTER VACCINE LIVE 19400 UNT/0.65ML ~~LOC~~ SUSR: 0.6500 mL | Freq: Once | SUBCUTANEOUS | 0 refills | Status: AC

## 2016-01-13 NOTE — Progress Notes (Signed)
Pre visit review using our clinic review tool, if applicable. No additional management support is needed unless otherwise documented below in the visit note. 

## 2016-01-13 NOTE — Progress Notes (Signed)
Subjective:  Patient ID: Jeffrey Watkins, male    DOB: [birth year]  Age: 75 y.o. MRN: [id number]  CC: Hypertension   HPI ISSACC TALK presents for a BP check - he feels well and offers no complaints. He denies any recent episodes of abdominal pain, dysuria, hematuria, weight loss, or loss of appetite.  Outpatient Medications Prior to Visit  Medication Sig Dispense Refill  . Ascorbic Acid (VITAMIN C) 1000 MG tablet Take 1,000 mg by mouth daily.     Marland Kitchen aspirin EC 81 MG tablet Take 81 mg by mouth daily.    . Calcium-Magnesium (CAL-MAG PO) Take 1 tablet by mouth 2 (two) times daily.    . Cholecalciferol (VITAMIN D) 2000 UNITS CAPS Take 2,000 Units by mouth daily.     Marland Kitchen losartan (COZAAR) 50 MG tablet TAKE 1 TABLET (50 MG TOTAL) BY MOUTH DAILY. 90 tablet 3  . Melatonin 3 MG TABS Take 3 mg by mouth at bedtime. 3 mg    . niacin 500 MG tablet Take 500 mg by mouth 3 (three) times daily.    . Probiotic Product (PROBIOTIC DAILY PO) Take 1 tablet by mouth daily with breakfast.     . TOCOPHEROLS-TOCOTRIENOLS PO Take 1 tablet by mouth daily.     Marland Kitchen b complex vitamins tablet Take 1 tablet by mouth daily.     Marland Kitchen Co-Enzyme Q-10 100 MG CAPS Take 100 mg by mouth 3 (three) times daily.    Marland Kitchen GRAPE SEED EXTRACT PO Take 150 mg by mouth daily with lunch.     . ibuprofen (ADVIL) 200 MG tablet Take 200 mg by mouth every 6 (six) hours as needed (For pain.).    Marland Kitchen Krill Oil 500 MG CAPS Take 500 mg by mouth 2 (two) times daily.    . Multiple Vitamin (MULTIVITAMIN) tablet Take 1 tablet by mouth daily.     Marland Kitchen OVER THE COUNTER MEDICATION Take 1,500 mg by mouth 3 (three) times daily. Amyloid Clear 1500mg     . OVER THE COUNTER MEDICATION Take 700 mg by mouth 3 (three) times daily with meals. L-Arginine 350mg     . OVER THE COUNTER MEDICATION Take 200 mg by mouth 2 (two) times daily. Resveratrol 200 mg    . oxyCODONE-acetaminophen (PERCOCET) 7.5-325 MG tablet Take 1 tablet by mouth every 4 (four) hours as needed for severe  pain. 30 tablet 0  . PHYTOSTEROLS PO Take 1 tablet by mouth as needed (Take before meals with cholesterol.).     Marland Kitchen Specialty Vitamins Products (PROSTATE PO) Take 5 mLs by mouth daily. 6 grams daily     No facility-administered medications prior to visit.     ROS Review of Systems  Constitutional: Negative.  Negative for activity change, appetite change, chills, diaphoresis, fatigue and fever.  HENT: Negative.   Eyes: Negative.  Negative for visual disturbance.  Respiratory: Negative for cough, choking, chest tightness, shortness of breath and stridor.   Cardiovascular: Negative for chest pain, palpitations and leg swelling.  Gastrointestinal: Negative.  Negative for abdominal pain, diarrhea and nausea.  Endocrine: Negative.   Genitourinary: Negative.  Negative for difficulty urinating, dysuria, flank pain, frequency and hematuria.  Musculoskeletal: Negative.  Negative for back pain and neck pain.  Skin: Negative.   Allergic/Immunologic: Negative.   Neurological: Negative.  Negative for dizziness, weakness and light-headedness.  Hematological: Negative.   Psychiatric/Behavioral: Negative.     Objective:  BP 124/76 (BP Location: Left Arm, Patient Position: Sitting, Cuff Size: Large)   Pulse  94   Temp 98.6 F (37 C) (Oral)   Resp 16   Ht 5\' 11"  (1.803 m)   Wt 200 lb 12 oz (91.1 kg)   SpO2 93%   BMI 28.00 kg/m   BP Readings from Last 3 Encounters:  01/13/16 124/76  08/26/15 139/79  08/20/15 (!) 158/92    Wt Readings from Last 3 Encounters:  01/13/16 200 lb 12 oz (91.1 kg)  08/26/15 201 lb (91.2 kg)  08/20/15 201 lb (91.2 kg)    Physical Exam  Constitutional: He is oriented to person, place, and time. No distress.  HENT:  Mouth/Throat: Oropharynx is clear and moist. No oropharyngeal exudate.  Eyes: Conjunctivae are normal. Right eye exhibits no discharge. Left eye exhibits no discharge. No scleral icterus.  Neck: Normal range of motion. Neck supple. No JVD present. No  tracheal deviation present. No thyromegaly present.  Cardiovascular: Normal rate, regular rhythm and intact distal pulses.  Exam reveals no gallop and no friction rub.   Murmur heard.  Systolic murmur is present with a grade of 1/6   No diastolic murmur is present  1/6 SEM over RUSB  Pulmonary/Chest: Effort normal and breath sounds normal. No stridor. No respiratory distress. He has no wheezes. He has no rales. He exhibits no tenderness.  Abdominal: Soft. Bowel sounds are normal. He exhibits no distension and no mass. There is no tenderness. There is no rebound and no guarding.  Musculoskeletal: Normal range of motion. He exhibits no edema, tenderness or deformity.  Lymphadenopathy:    He has no cervical adenopathy.  Neurological: He is oriented to person, place, and time.  Skin: Skin is warm and dry. No rash noted. He is not diaphoretic. No erythema. No pallor.  Vitals reviewed.   Lab Results  Component Value Date   WBC 6.9 08/20/2015   HGB 14.5 08/20/2015   HCT 40.2 08/20/2015   PLT 192 08/20/2015   GLUCOSE 108 (H) 01/13/2016   CHOL 219 (H) 06/11/2015   TRIG 112.0 06/11/2015   HDL 52.80 06/11/2015   LDLDIRECT 138.8 04/17/2012   LDLCALC 144 (H) 06/11/2015   ALT 19 08/20/2015   AST 22 08/20/2015   NA 140 01/13/2016   K 4.7 01/13/2016   CL 102 01/13/2016   CREATININE 1.18 01/13/2016   BUN 14 01/13/2016   CO2 31 01/13/2016   TSH 3.12 06/11/2015   PSA 1.16 06/11/2015   HGBA1C 6.0 06/11/2015   MICROALBUR 0.7 02/22/2006    No results found.  Assessment & Plan:   Jeffrey Watkins was seen today for hypertension.  Diagnoses and all orders for this visit:  Bilateral renal masses- He is due for his 6 month interval follow-up on these masses to be certain that there is no malignant transformation or complications such as bleeding or infection -     MR Abdomen W Wo Contrast; Future  Hematuria, microscopic- he has persistent, mild, microscopic hematuria, MRI of the abdomen has been  ordered for follow-up -     MR Abdomen W Wo Contrast; Future  Liver mass- he is due for a six-month follow-up to be certain that this hasn't developed any signs of malignant transformation -     MR Abdomen W Wo Contrast; Future  Essential hypertension- his blood pressure is well-controlled -     Basic metabolic panel; Future -     Urinalysis, Routine w reflex microscopic (not at [facility]); Future  Hyperglycemia- his A1c is 6%, he has mild prediabetes, no medications are needed, he agrees  to work on his lifestyle modifications. -     Basic metabolic panel; Future  Hyperuricemia- he has a mild elevation in his uric acid level but no recent episodes of gouty arthritis, at this time he is not willing to take a medication to lower his uric acid level to prevent gout attacks -     Uric acid; Future  Other orders -     Zoster Vaccine Live, PF, (ZOSTAVAX) 09811 UNT/0.65ML injection; Inject 19,400 Units into the skin once.   I have discontinued Mr. Mook multivitamin, GRAPE SEED EXTRACT PO, Specialty Vitamins Products (PROSTATE PO), b complex vitamins, PHYTOSTEROLS PO, OVER THE COUNTER MEDICATION, OVER THE COUNTER MEDICATION, Co-Enzyme Q-10, OVER THE COUNTER MEDICATION, Krill Oil, ibuprofen, and oxyCODONE-acetaminophen. I am also having him start on Zoster Vaccine Live (PF). Additionally, I am having him maintain his vitamin C, TOCOPHEROLS-TOCOTRIENOLS PO, Vitamin D, Calcium-Magnesium (CAL-MAG PO), Melatonin, Probiotic Product (PROBIOTIC DAILY PO), aspirin EC, niacin, losartan, TURMERIC PO, and CINNAMON PO.  Meds ordered this encounter  Medications  . TURMERIC PO    Sig: Take by mouth.  Marland Kitchen CINNAMON PO    Sig: Take by mouth.  . Zoster Vaccine Live, PF, (ZOSTAVAX) [id number] UNT/0.65ML injection    Sig: Inject 19,400 Units into the skin once.    Dispense:  1 each    Refill:  0     Follow-up: Return in about 6 months (around 07/12/2016).  Scarlette Calico, MD

## 2016-01-13 NOTE — Patient Instructions (Signed)
Hypertension Hypertension, commonly called high blood pressure, is when the force of blood pumping through your arteries is too strong. Your arteries are the blood vessels that carry blood from your heart throughout your body. A blood pressure reading consists of a higher number over a lower number, such as 110/72. The higher number (systolic) is the pressure inside your arteries when your heart pumps. The lower number (diastolic) is the pressure inside your arteries when your heart relaxes. Ideally you want your blood pressure below 120/80. Hypertension forces your heart to work harder to pump blood. Your arteries may become narrow or stiff. Having untreated or uncontrolled hypertension can cause heart attack, stroke, kidney disease, and other problems. RISK FACTORS Some risk factors for high blood pressure are controllable. Others are not.  Risk factors you cannot control include:   Race. You may be at higher risk if you are African American.  Age. Risk increases with age.  Gender. Men are at higher risk than women before age 45 years. After age 65, women are at higher risk than men. Risk factors you can control include:  Not getting enough exercise or physical activity.  Being overweight.  Getting too much fat, sugar, calories, or salt in your diet.  Drinking too much alcohol. SIGNS AND SYMPTOMS Hypertension does not usually cause signs or symptoms. Extremely high blood pressure (hypertensive crisis) may cause headache, anxiety, shortness of breath, and nosebleed. DIAGNOSIS To check if you have hypertension, your health care provider will measure your blood pressure while you are seated, with your arm held at the level of your heart. It should be measured at least twice using the same arm. Certain conditions can cause a difference in blood pressure between your right and left arms. A blood pressure reading that is higher than normal on one occasion does not mean that you need treatment. If  it is not clear whether you have high blood pressure, you may be asked to return on a different day to have your blood pressure checked again. Or, you may be asked to monitor your blood pressure at home for 1 or more weeks. TREATMENT Treating high blood pressure includes making lifestyle changes and possibly taking medicine. Living a healthy lifestyle can help lower high blood pressure. You may need to change some of your habits. Lifestyle changes may include:  Following the DASH diet. This diet is high in fruits, vegetables, and whole grains. It is low in salt, red meat, and added sugars.  Keep your sodium intake below 2,300 mg per day.  Getting at least 30-45 minutes of aerobic exercise at least 4 times per week.  Losing weight if necessary.  Not smoking.  Limiting alcoholic beverages.  Learning ways to reduce stress. Your health care provider may prescribe medicine if lifestyle changes are not enough to get your blood pressure under control, and if one of the following is true:  You are 18-59 years of age and your systolic blood pressure is above 140.  You are 60 years of age or older, and your systolic blood pressure is above 150.  Your diastolic blood pressure is above 90.  You have diabetes, and your systolic blood pressure is over 140 or your diastolic blood pressure is over 90.  You have kidney disease and your blood pressure is above 140/90.  You have heart disease and your blood pressure is above 140/90. Your personal target blood pressure may vary depending on your medical conditions, your age, and other factors. HOME CARE INSTRUCTIONS    Have your blood pressure rechecked as directed by your health care provider.   Take medicines only as directed by your health care provider. Follow the directions carefully. Blood pressure medicines must be taken as prescribed. The medicine does not work as well when you skip doses. Skipping doses also puts you at risk for  problems.  Do not smoke.   Monitor your blood pressure at home as directed by your health care provider. SEEK MEDICAL CARE IF:   You think you are having a reaction to medicines taken.  You have recurrent headaches or feel dizzy.  You have swelling in your ankles.  You have trouble with your vision. SEEK IMMEDIATE MEDICAL CARE IF:  You develop a severe headache or confusion.  You have unusual weakness, numbness, or feel faint.  You have severe chest or abdominal pain.  You vomit repeatedly.  You have trouble breathing. MAKE SURE YOU:   Understand these instructions.  Will watch your condition.  Will get help right away if you are not doing well or get worse.   This information is not intended to replace advice given to you by your health care provider. Make sure you discuss any questions you have with your health care provider.   Document Released: 02/22/2005 Document Revised: 07/09/2014 Document Reviewed: 12/15/2012 Elsevier Interactive Patient Education 2016 [employer] Inc[employer]  

## 2016-02-03 ENCOUNTER — Ambulatory Visit
Admission: RE | Admit: 2016-02-03 | Discharge: 2016-02-03 | Disposition: A | Discharge status: Home / Self Care | Payer: Medicare Other | Source: Ambulatory Visit | Attending: Internal Medicine | Admitting: Internal Medicine

## 2016-02-03 ENCOUNTER — Encounter: Payer: Self-pay | Admitting: Internal Medicine

## 2016-02-03 DIAGNOSIS — R160 Hepatomegaly, not elsewhere classified: Secondary | ICD-10-CM

## 2016-02-03 DIAGNOSIS — N2889 Other specified disorders of kidney and ureter: Secondary | ICD-10-CM

## 2016-02-03 DIAGNOSIS — R3129 Other microscopic hematuria: Secondary | ICD-10-CM

## 2016-02-03 MED ORDER — GADOBENATE DIMEGLUMINE 529 MG/ML IV SOLN
19.0000 mL | Freq: Once | INTRAVENOUS | Status: AC | PRN
  Administered 2016-02-03: 19 mL via INTRAVENOUS

## 2016-05-24 ENCOUNTER — Ambulatory Visit (INDEPENDENT_AMBULATORY_CARE_PROVIDER_SITE_OTHER)
Admission: RE | Admit: 2016-05-24 | Discharge: 2016-05-24 | Disposition: A | Discharge status: Home / Self Care | Payer: Medicare Other | Source: Ambulatory Visit | Attending: Internal Medicine | Admitting: Internal Medicine

## 2016-05-24 ENCOUNTER — Encounter: Payer: Self-pay | Admitting: Internal Medicine

## 2016-05-24 ENCOUNTER — Ambulatory Visit (INDEPENDENT_AMBULATORY_CARE_PROVIDER_SITE_OTHER): Payer: Medicare Other | Admitting: Internal Medicine

## 2016-05-24 VITALS — BP 144/80 | HR 99 | Temp 98.1°F | Resp 16 | Ht 71.0 in | Wt 200.0 lb

## 2016-05-24 DIAGNOSIS — G8929 Other chronic pain: Secondary | ICD-10-CM | POA: Diagnosis not present

## 2016-05-24 DIAGNOSIS — M79642 Pain in left hand: Secondary | ICD-10-CM

## 2016-05-24 DIAGNOSIS — M1812 Unilateral primary osteoarthritis of first carpometacarpal joint, left hand: Secondary | ICD-10-CM

## 2016-05-24 NOTE — Progress Notes (Signed)
Pre visit review using our clinic review tool, if applicable. No additional management support is needed unless otherwise documented below in the visit note. 

## 2016-05-24 NOTE — Progress Notes (Signed)
Subjective:  Patient ID: Jeffrey Watkins, male    DOB: 1940/06/08  Age: 76 y.o. MRN: [id number]  CC: Hand Pain   HPI DEERIC CRUISE presents for A 3 month history of worsening discomfort in his left hand at the base of the thumb. He denies any trauma or injury. He is getting symptom relief with Tylenol and doesn't want to take anything other than that for pain. He says the pain is worsened by bending and straightening the thumb. He has not noticed any redness or swelling and tells me none of his other joints bother him.  Outpatient Medications Prior to Visit  Medication Sig Dispense Refill  . Ascorbic Acid (VITAMIN C) 1000 MG tablet Take 1,000 mg by mouth daily.     Marland Kitchen aspirin EC 81 MG tablet Take 81 mg by mouth daily.    . Calcium-Magnesium (CAL-MAG PO) Take 1 tablet by mouth 2 (two) times daily.    . Cholecalciferol (VITAMIN D) 2000 UNITS CAPS Take 2,000 Units by mouth daily.     Marland Kitchen CINNAMON PO Take by mouth.    . losartan (COZAAR) 50 MG tablet TAKE 1 TABLET (50 MG TOTAL) BY MOUTH DAILY. 90 tablet 3  . Melatonin 3 MG TABS Take 3 mg by mouth at bedtime. 3 mg    . niacin 500 MG tablet Take 500 mg by mouth 3 (three) times daily.    . Probiotic Product (PROBIOTIC DAILY PO) Take 1 tablet by mouth daily with breakfast.     . TOCOPHEROLS-TOCOTRIENOLS PO Take 1 tablet by mouth daily.     . TURMERIC PO Take by mouth.     No facility-administered medications prior to visit.     ROS Review of Systems  Musculoskeletal: Positive for arthralgias.  All other systems reviewed and are negative.   Objective:  BP (!) 144/80 (BP Location: Left Arm, Patient Position: Sitting, Cuff Size: Normal)   Pulse 99   Temp 98.1 F (36.7 C) (Oral)   Resp 16   Ht 5\' 11"  (1.803 m)   Wt 200 lb (90.7 kg)   SpO2 96%   BMI 27.89 kg/m   BP Readings from Last 3 Encounters:  05/24/16 (!) 144/80  01/13/16 124/76  08/26/15 139/79    Wt Readings from Last 3 Encounters:  05/24/16 200 lb (90.7 kg)  01/13/16 200  lb 12 oz (91.1 kg)  08/26/15 201 lb (91.2 kg)    Physical Exam  Musculoskeletal:       Left hand: He exhibits bony tenderness and deformity (boney prominence over the dorsum of the CMC joint). He exhibits normal range of motion, no tenderness, normal two-point discrimination, normal capillary refill, no laceration and no swelling. Normal sensation noted. Normal strength noted.    Lab Results  Component Value Date   WBC 6.9 08/20/2015   HGB 14.5 08/20/2015   HCT 40.2 08/20/2015   PLT 192 08/20/2015   GLUCOSE 108 (H) 01/13/2016   CHOL 219 (H) 06/11/2015   TRIG 112.0 06/11/2015   HDL 52.80 06/11/2015   LDLDIRECT 138.8 04/17/2012   LDLCALC 144 (H) 06/11/2015   ALT 19 08/20/2015   AST 22 08/20/2015   NA 140 01/13/2016   K 4.7 01/13/2016   CL 102 01/13/2016   CREATININE 1.18 01/13/2016   BUN 14 01/13/2016   CO2 31 01/13/2016   TSH 3.12 06/11/2015   PSA 1.16 06/11/2015   HGBA1C 6.0 06/11/2015   MICROALBUR 0.7 02/22/2006    Mr Abdomen W Wo Contrast  Result Date: 02/03/2016 CLINICAL DATA:  Followup complex left renal cyst. EXAM: MRI ABDOMEN WITHOUT AND WITH CONTRAST TECHNIQUE: Multiplanar multisequence MR imaging of the abdomen was performed both before and after the administration of intravenous contrast. CONTRAST:  38mL MULTIHANCE GADOBENATE DIMEGLUMINE 529 MG/ML IV SOLN COMPARISON:  MRI 07/10/2015 FINDINGS: Lower chest: The lung bases are clear. No pulmonary lesions, pleural or pericardial effusion. Hepatobiliary: Stable 19 mm hemangioma noted in segment 5 of the liver. No intrahepatic biliary dilatation. No new lesions. The gallbladder is normal. Normal caliber and course of the common bile duct. Pancreas: No mass, inflammation or ductal dilatation. Incidental pancreatic divisum. Spleen:  Normal size.  No focal lesions. Adrenals/Urinary Tract: The adrenal glands are unremarkable and stable. The right kidney demonstrates multiple large cysts and scattered smaller cysts. These are  stable. No worrisome right renal lesions. There is a stable slightly irregular cystic lesion projecting off the lower pole region of the left kidney anteriorly. It measures approximately 14 mm and is unchanged when compared to the prior MRI examination. Increased T1 signal intensity on the precontrast images suggests hemorrhage but again, there does appear to be nodular enhancement postcontrast. This could be a vessel. The lesion is diffusion negative. This is still considered a Bosniak 2 F lesion and a followup MR examination in 1 year is suggested to document stability. Stomach/Bowel: Visualized portions within the abdomen are unremarkable. Vascular/Lymphatic: No pathologically enlarged lymph nodes identified. No abdominal aortic aneurysm demonstrated. Other:  No ascites or abdominal wall hernia. Musculoskeletal: No significant bony findings. IMPRESSION: 1. 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 Recommend followup MR examination in 1 year. 2. Stable multiple other bilateral benign renal cysts. 3. Stable right hepatic lobe hemangioma. 4. Incidental pancreatic divisum. Electronically Signed   By: Marijo Sanes M.D.   On: 02/03/2016 12:31    Assessment & Plan:   [name] was seen today for hand pain.  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 Racanelli maintain his vitamin C, TOCOPHEROLS-TOCOTRIENOLS PO, Vitamin D, Calcium-Magnesium (CAL-MAG PO), Melatonin, Probiotic Product (PROBIOTIC DAILY PO), aspirin EC, niacin, losartan, TURMERIC PO, CINNAMON PO, and halobetasol.  Meds  ordered this encounter  Medications  . halobetasol (ULTRAVATE) 0.05 % cream    Sig: APPLY TO AFFECTED AREA ON SKIN TWICE A DAY    Refill:  2     Follow-up: No Follow-up on file.  Scarlette Calico, MD

## 2016-05-24 NOTE — Patient Instructions (Signed)
Arthritis Arthritis means joint pain. It can also mean joint disease. A joint is a place where bones come together. People who have arthritis may have:  Red joints.  Swollen joints.  Stiff joints.  Warm joints.  A fever.  A feeling of being sick. Follow these instructions at home: Pay attention to any changes in your symptoms. Take these actions to help with your pain and swelling. Medicines   Take over-the-counter and prescription medicines only as told by your doctor.  Do not take aspirin for pain if your doctor says that you may have gout. Activity   Rest your joint if your doctor tells you to.  Avoid activities that make the pain worse.  Exercise your joint regularly as told by your doctor. Try doing exercises like:  Swimming.  Water aerobics.  Biking.  Walking. Joint Care    If your joint is swollen, keep it raised (elevated) if told by your doctor.  If your joint feels stiff in the morning, try taking a warm shower.  If you have diabetes, do not apply heat without asking your doctor.  If told, apply heat to the joint:  Put a towel between the joint and the hot pack or heating pad.  Leave the heat on the area for 20-30 minutes.  If told, apply ice to the joint:  Put ice in a plastic bag.  Place a towel between your skin and the bag.  Leave the ice on for 20 minutes, 2-3 times per day.  Keep all follow-up visits as told by your doctor. Contact a doctor if:  The pain gets worse.  You have a fever. Get help right away if:  You have very bad pain in your joint.  You have swelling in your joint.  Your joint is red.  Many joints become painful and swollen.  You have very bad back pain.  Your leg is very weak.  You cannot control your pee (urine) or poop (stool). This information is not intended to replace advice given to you by your health care provider. Make sure you discuss any questions you have with your health care  provider. Document Released: 05/19/2009 Document Revised: 07/31/2015 Document Reviewed: 05/20/2014 Elsevier Interactive Patient Education  2017 [employer] Inc.  

## 2016-05-25 ENCOUNTER — Encounter: Payer: Self-pay | Admitting: Internal Medicine

## 2016-09-02 ENCOUNTER — Ambulatory Visit (INDEPENDENT_AMBULATORY_CARE_PROVIDER_SITE_OTHER): Payer: Medicare Other | Admitting: Orthopaedic Surgery

## 2016-09-02 DIAGNOSIS — M67442 Ganglion, left hand: Secondary | ICD-10-CM

## 2016-09-02 NOTE — Progress Notes (Signed)
Office Visit Note   Patient: Jeffrey Watkins           Date of Birth: January 19, 1941           MRN: [id number] Visit Date: 09/02/2016              Requested by: Janith Lima, MD [address] [place], [place] [address] PCP: Janith Lima, MD   Assessment & Plan: Visit Diagnoses:  1. Ganglion cyst of joint of finger of left hand     Plan: This definitely appears to be more of a cyst to me. I cleaned the soft tissue Betadine and alcohol and with an 18-gauge needle was able to aspirate gelatinous material from this area consistent with a ganglion cyst. I then placed a compressive dressing around this. I gave him reassurance that this is a benign process with what looks like. I would not recommend surgery at this point I would recommend at least a second aspiration with a steroid injection of it recurs which there is a chance this may. All questions were encouraged and answered. Follow-Up Instructions: Return if symptoms worsen or fail to improve.   Orders:  No orders of the defined types were placed in this encounter.  No orders of the defined types were placed in this encounter.     Procedures: No procedures performed   Clinical Data: No additional findings.   Subjective: No chief complaint on file. The patient is a very pleasant 76 year old right-hand-dominant male referred from Dr. Scarlette Calico to evaluate her left hand bone spur versus a cyst. He sees no sig on slowly for about 6 months now and he is wondering if he needs an injection versus surgery. He has no known injury to this but he has painful range of motion of his thumb started bothering him he denies a numbness and tingling or any other issues with the hand. He is otherwise a healthy individual who does have a history of other medical problems that are not active at the moment. His wife is with him today.  HPI  Review of Systems He currently denies any headache, chest pain, shortness of breath, fever,  chills, nausea, vomiting.  Objective: Vital Signs: There were no vitals taken for this visit.  Physical Exam He is alert or 3 in no acute distress Ortho Exam  Examination of left hand shows a soft tissue prominence of the dorsum and radial aspect of the left thumb the [facility] joint. He does have positive grind test the [facility] joint but this feels hypermobile in the soft tissue to me. He has full range of motion of his thumb and his wrist and hand in general. There is no Tinel sign over this mass and is thumb is well perfused and intact sensory exam. Specialty Comments:  No specialty comments available.  Imaging: No results found. 3 views of his left hand for review the trauma canopy system reviewed by me and apparently. I see no irregularities of the bone of his left hand.  PMFS History: Patient Active Problem List   Diagnosis Date Noted  . Chronic hand pain, left 05/24/2016  . Arthritis of carpometacarpal [facility]) joint of left thumb 05/24/2016  . Liver mass 07/01/2015  . Bilateral renal masses 07/01/2015  . BPH associated with nocturia 06/11/2015  . Unilateral recurrent inguinal hernia without obstruction or gangrene 06/11/2015  . Routine general medical examination at a health care facility 06/11/2015  . Hematuria, microscopic 06/11/2015  . Hyperglycemia  06/06/2014  . Nonspecific abnormal electrocardiogram (ECG) (EKG) 03/24/2011  . Essential hypertension 07/26/2007  . Mixed hyperlipidemia 06/28/2007  . Hyperuricemia 06/28/2007   Past Medical History:  Diagnosis Date  . Chronic kidney disease    noncancerous nodules in kidney   . HTN (hypertension)   . Hydrocele    R scrotum  . Hyperlipidemia   . 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 Heart attack Mother        MI > 6  . Heart attack Father 48  . Aneurysm Brother 52       AAA  . Cancer Neg Hx   . Diabetes Neg Hx   . Transient ischemic attack Maternal  Grandmother        in late 80s    Past Surgical History:  Procedure Laterality Date  . COLONOSCOPY     neg; Dr Deatra Ina  . INGUINAL HERNIA REPAIR Left 07/11/2012   Procedure: LAPAROSCOPIC INGUINAL HERNIA;  Surgeon: Adin Hector, MD;  Location: [facility];  Service: General;  Laterality: Left;  . INGUINAL HERNIA REPAIR Left 08/26/2015   Procedure: OPEN REPAIR RECURRENT LEFT INGUINAL HERNIA WITH MESH;  Surgeon: Fanny Skates, MD;  Location: [facility];  Service: General;  Laterality: Left;  . INSERTION OF MESH Left 07/11/2012   Procedure: INSERTION OF MESH;  Surgeon: Adin Hector, MD;  Location: [facility];  Service: General;  Laterality: Left;  . LITHOTRIPSY     X 1  . urinary stent  1996 or 1997   Social History   Occupational History  . Not on file.   Social History Main Topics  . Smoking status: Never Smoker  . Smokeless tobacco: Never Used  . Alcohol use 0.0 oz/week     Comment:  rarely; 3 glasses / year  . Drug use: No  . Sexual activity: Not Currently

## 2016-10-27 ENCOUNTER — Other Ambulatory Visit: Payer: Self-pay | Admitting: Internal Medicine

## 2016-10-27 DIAGNOSIS — I10 Essential (primary) hypertension: Secondary | ICD-10-CM

## 2016-12-23 ENCOUNTER — Ambulatory Visit (INDEPENDENT_AMBULATORY_CARE_PROVIDER_SITE_OTHER): Payer: Medicare Other | Admitting: General Practice

## 2016-12-23 DIAGNOSIS — Z23 Encounter for immunization: Secondary | ICD-10-CM | POA: Diagnosis not present

## 2017-01-19 ENCOUNTER — Other Ambulatory Visit (INDEPENDENT_AMBULATORY_CARE_PROVIDER_SITE_OTHER): Payer: Medicare Other

## 2017-01-19 ENCOUNTER — Encounter: Payer: Self-pay | Admitting: Internal Medicine

## 2017-01-19 ENCOUNTER — Ambulatory Visit (INDEPENDENT_AMBULATORY_CARE_PROVIDER_SITE_OTHER): Payer: Medicare Other | Admitting: Internal Medicine

## 2017-01-19 VITALS — BP 138/82 | HR 87 | Temp 98.3°F | Resp 16 | Ht 71.0 in | Wt 203.1 lb

## 2017-01-19 DIAGNOSIS — E782 Mixed hyperlipidemia: Secondary | ICD-10-CM | POA: Diagnosis not present

## 2017-01-19 DIAGNOSIS — N2889 Other specified disorders of kidney and ureter: Secondary | ICD-10-CM | POA: Diagnosis not present

## 2017-01-19 DIAGNOSIS — R739 Hyperglycemia, unspecified: Secondary | ICD-10-CM | POA: Diagnosis not present

## 2017-01-19 DIAGNOSIS — E790 Hyperuricemia without signs of inflammatory arthritis and tophaceous disease: Secondary | ICD-10-CM | POA: Diagnosis not present

## 2017-01-19 DIAGNOSIS — R351 Nocturia: Secondary | ICD-10-CM | POA: Diagnosis not present

## 2017-01-19 DIAGNOSIS — N401 Enlarged prostate with lower urinary tract symptoms: Secondary | ICD-10-CM

## 2017-01-19 DIAGNOSIS — R3129 Other microscopic hematuria: Secondary | ICD-10-CM | POA: Diagnosis not present

## 2017-01-19 DIAGNOSIS — Z0000 Encounter for general adult medical examination without abnormal findings: Secondary | ICD-10-CM | POA: Diagnosis not present

## 2017-01-19 DIAGNOSIS — I10 Essential (primary) hypertension: Secondary | ICD-10-CM | POA: Diagnosis not present

## 2017-01-19 LAB — URINALYSIS, ROUTINE W REFLEX MICROSCOPIC
Bilirubin Urine: NEGATIVE
Ketones, ur: NEGATIVE
LEUKOCYTES UA: NEGATIVE
NITRITE: NEGATIVE
Specific Gravity, Urine: 1.02 (ref 1.000–1.030)
Total Protein, Urine: NEGATIVE
URINE GLUCOSE: NEGATIVE
Urobilinogen, UA: 0.2 (ref 0.0–1.0)
WBC UA: NONE SEEN (ref 0–?)
pH: 6.5 (ref 5.0–8.0)

## 2017-01-19 LAB — CBC WITH DIFFERENTIAL/PLATELET
BASOS ABS: 0 10*3/uL (ref 0.0–0.1)
Basophils Relative: 0.3 % (ref 0.0–3.0)
EOS ABS: 0.1 10*3/uL (ref 0.0–0.7)
Eosinophils Relative: 1.4 % (ref 0.0–5.0)
HEMATOCRIT: 44.1 % (ref 39.0–52.0)
Hemoglobin: 15.1 g/dL (ref 13.0–17.0)
LYMPHS PCT: 21.3 % (ref 12.0–46.0)
Lymphs Abs: 2 10*3/uL (ref 0.7–4.0)
MCHC: 34.2 g/dL (ref 30.0–36.0)
MCV: 92.8 fl (ref 78.0–100.0)
Monocytes Absolute: 0.8 10*3/uL (ref 0.1–1.0)
Monocytes Relative: 8.5 % (ref 3.0–12.0)
NEUTROS ABS: 6.3 10*3/uL (ref 1.4–7.7)
NEUTROS PCT: 68.5 % (ref 43.0–77.0)
PLATELETS: 276 10*3/uL (ref 150.0–400.0)
RBC: 4.76 Mil/uL (ref 4.22–5.81)
RDW: 13.2 % (ref 11.5–15.5)
WBC: 9.2 10*3/uL (ref 4.0–10.5)

## 2017-01-19 LAB — COMPREHENSIVE METABOLIC PANEL
ALT: 18 U/L (ref 0–53)
AST: 19 U/L (ref 0–37)
Albumin: 4.3 g/dL (ref 3.5–5.2)
Alkaline Phosphatase: 36 U/L — ABNORMAL LOW (ref 39–117)
BUN: 11 mg/dL (ref 6–23)
CHLORIDE: 100 meq/L (ref 96–112)
CO2: 31 meq/L (ref 19–32)
CREATININE: 1.19 mg/dL (ref 0.40–1.50)
Calcium: 10 mg/dL (ref 8.4–10.5)
GFR: 63.11 mL/min (ref >60.00)
Glucose, Bld: 106 mg/dL — ABNORMAL HIGH (ref 70–99)
Potassium: 4.6 mEq/L (ref 3.5–5.1)
SODIUM: 138 meq/L (ref 135–145)
Total Bilirubin: 0.5 mg/dL (ref 0.2–1.2)
Total Protein: 6.7 g/dL (ref 6.0–8.3)

## 2017-01-19 LAB — HEMOGLOBIN A1C: HEMOGLOBIN A1C: 5.8 % (ref 4.6–6.5)

## 2017-01-19 LAB — URIC ACID: Uric Acid, Serum: 7.3 mg/dL (ref 4.0–7.8)

## 2017-01-19 LAB — LIPID PANEL
CHOL/HDL RATIO: 5
Cholesterol: 244 mg/dL — ABNORMAL HIGH (ref 0–200)
HDL: 47.6 mg/dL (ref >39.00)
LDL CALC: 166 mg/dL — AB (ref 0–99)
NonHDL: 196.66
TRIGLYCERIDES: 153 mg/dL — AB (ref 0.0–149.0)
VLDL: 30.6 mg/dL (ref 0.0–40.0)

## 2017-01-19 LAB — TSH: TSH: 2.26 u[IU]/mL (ref 0.35–4.50)

## 2017-01-19 LAB — PSA: PSA: 1.63 ng/mL (ref 0.10–4.00)

## 2017-01-19 MED ORDER — LOSARTAN POTASSIUM 50 MG PO TABS: 50.0000 mg | ORAL_TABLET | Freq: Every day | ORAL | 3 refills | Status: DC

## 2017-01-19 NOTE — Progress Notes (Signed)
Subjective:  Patient ID: Jeffrey Watkins, male    DOB: 1940/06/16  Age: 76 y.o. MRN: [id number]  CC: Annual Exam (Patient is here today for an annual exam.  He is currently fasting.  He will schedule an eye exam as he is due. )   HPI GEE HABIG presents for a CPX.  He tells me his blood pressure has been well controlled.  He has had no recent episodes of chest pain, shortness of breath, headache, blurred vision, or dizziness.  He has chronic hematuria and renal cysts.  He is due for follow-up MRI to be certain that the cystshave not transformed into renal cell carcinoma.  He denies dysuria or hematuria.  Past Medical History:  Diagnosis Date  . Chronic kidney disease    noncancerous nodules in kidney   . HTN (hypertension)   . Hydrocele    R scrotum  . Hyperlipidemia   . 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 COLONOSCOPY     neg; Dr Deatra Ina  . INGUINAL HERNIA REPAIR Left 07/11/2012   Procedure: LAPAROSCOPIC INGUINAL HERNIA;  Surgeon: Adin Hector, MD;  Location: [facility];  Service: General;  Laterality: Left;  . INGUINAL HERNIA REPAIR Left 08/26/2015   Procedure: OPEN REPAIR RECURRENT LEFT INGUINAL HERNIA WITH MESH;  Surgeon: Fanny Skates, MD;  [facility]: [facility];  Service: General;  Laterality: Left;  . INSERTION OF MESH Left 07/11/2012   Procedure: INSERTION OF MESH;  Surgeon: Adin Hector, MD;  [facility]: [facility];  Service: General;  Laterality: Left;  . LITHOTRIPSY     X 1  . urinary stent  1996 or 1997    reports that  has never smoked. he has never used smokeless tobacco. He reports that he drinks alcohol. He reports that he does not use drugs. family history includes Aneurysm (age of onset: 22) in his brother; Heart attack in his mother; Heart attack (age of onset: 55) in his father; Transient ischemic attack in his maternal grandmother. Allergies  Allergen Reactions  . Statins Other (See  Comments)    Muscle aches  . Codeine Nausea And Vomiting  . Simvastatin Other (See Comments)    MUSCLE ACHES    Outpatient Medications Prior to Visit  Medication Sig Dispense Refill  . Ascorbic Acid (VITAMIN C) 1000 MG tablet Take 1,000 mg by mouth daily.     Marland Kitchen aspirin EC 81 MG tablet Take 81 mg by mouth daily.    . Calcium-Magnesium (CAL-MAG PO) Take 1 tablet by mouth 2 (two) times daily.    . Cholecalciferol (VITAMIN D) 2000 UNITS CAPS Take 2,000 Units by mouth daily.     Marland Kitchen CINNAMON PO Take by mouth.    . fluorouracil (EFUDEX) 5 % cream APPLY DAILY TO FOREHEAD AND SCALP AS DIRECTED  0  . halobetasol (ULTRAVATE) 0.05 % cream APPLY TO AFFECTED AREA ON SKIN TWICE A DAY  2  . Melatonin 3 MG TABS Take 3 mg by mouth at bedtime. 3 mg    . niacin 500 MG tablet Take 500 mg by mouth 3 (three) times daily.    . Probiotic Product (PROBIOTIC DAILY PO) Take 1 tablet by mouth daily with breakfast.     . TOCOPHEROLS-TOCOTRIENOLS PO Take 1 tablet by mouth daily.     . TURMERIC PO Take by mouth.    . losartan (COZAAR) 50 MG tablet TAKE 1  TABLET BY MOUTH DAILY 90 tablet 0   No facility-administered medications prior to visit.     ROS Review of Systems  Constitutional: Negative.  Negative for chills, diaphoresis, fatigue and fever.  HENT: Negative.  Negative for trouble swallowing.   Eyes: Negative.  Negative for visual disturbance.  Respiratory: Negative for cough, chest tightness, shortness of breath and wheezing.   Cardiovascular: Negative.  Negative for chest pain, palpitations and leg swelling.  Gastrointestinal: Negative for abdominal pain, constipation, diarrhea, nausea and vomiting.  Endocrine: Negative.   Genitourinary: Negative.  Negative for difficulty urinating, dysuria, flank pain, frequency, hematuria, penile swelling, scrotal swelling, testicular pain and urgency.  Musculoskeletal: Negative.  Negative for back pain, myalgias and neck pain.  Skin: Negative.  Negative for color change  and rash.  Neurological: Negative.  Negative for dizziness.  Hematological: Negative for adenopathy. Does not bruise/bleed easily.  Psychiatric/Behavioral: Negative.     Objective:  BP (!) 142/94 (BP Location: Right Arm, Patient Position: Sitting, Cuff Size: Normal)   Pulse 87   Temp 98.3 F (36.8 C) (Oral)   Ht 5\' 11"  (1.803 m)   Wt 203 lb 1.3 oz (92.1 kg)   SpO2 96%   BMI 28.32 kg/m   BP Readings from Last 3 Encounters:  01/19/17 (!) 142/94  05/24/16 (!) 144/80  01/13/16 124/76    Wt Readings from Last 3 Encounters:  01/19/17 203 lb 1.3 oz (92.1 kg)  05/24/16 200 lb (90.7 kg)  01/13/16 200 lb 12 oz (91.1 kg)    Physical Exam  Constitutional: He is oriented to person, place, and time.  Non-toxic appearance. He does not have a sickly appearance. He does not appear ill. No distress.  HENT:  Mouth/Throat: Oropharynx is clear and moist. No oropharyngeal exudate.  Eyes: Conjunctivae are normal. Right eye exhibits no discharge. Left eye exhibits no discharge. No scleral icterus.  Neck: Normal range of motion. Neck supple. No JVD present. No thyromegaly present.  Cardiovascular: Normal rate and regular rhythm. Exam reveals no gallop.  No murmur heard. Pulmonary/Chest: Effort normal and breath sounds normal. No respiratory distress. He has no wheezes. He has no rales. He exhibits no tenderness.  Abdominal: Soft. Bowel sounds are normal. He exhibits no distension and no mass. There is no tenderness. There is no rebound and no guarding. Hernia confirmed negative in the right inguinal area and confirmed negative in the left inguinal area.  Genitourinary: Rectum normal and penis normal. Rectal exam shows no external hemorrhoid, no internal hemorrhoid, no fissure, no mass, no tenderness and guaiac negative stool. Prostate is enlarged (1+ smooth symm BPH). Prostate is not tender. Right testis shows mass (varicocoele). Right testis shows no swelling and no tenderness. 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 Left testis shows no mass, no swelling and no tenderness. Left testis is descended. Circumcised. No penile erythema or penile tenderness. No discharge found.  Musculoskeletal: Normal range of motion. He exhibits no edema, tenderness or deformity.  Lymphadenopathy:       Right: No inguinal adenopathy present.       Left: No inguinal adenopathy present.  Neurological: He is alert and oriented to person, place, and time.  Skin: Skin is warm and dry. No rash noted. He is not diaphoretic. No erythema. No pallor.  Vitals reviewed.   Lab Results  Component Value Date   WBC 9.2 01/19/2017   HGB 15.1 01/19/2017   HCT 44.1 01/19/2017   PLT 276.0 01/19/2017   GLUCOSE 106 (H) 01/19/2017   CHOL  244 (H) 01/19/2017   TRIG 153.0 (H) 01/19/2017   HDL 47.60 01/19/2017   LDLDIRECT 138.8 04/17/2012   LDLCALC 166 (H) 01/19/2017   ALT 18 01/19/2017   AST 19 01/19/2017   NA 138 01/19/2017   K 4.6 01/19/2017   CL 100 01/19/2017   CREATININE 1.19 01/19/2017   BUN 11 01/19/2017   CO2 31 01/19/2017   TSH 2.26 01/19/2017   PSA 1.63 01/19/2017   HGBA1C 5.8 01/19/2017   MICROALBUR 0.7 02/22/2006    Dg Hand Complete Left  Result Date: 05/24/2016 CLINICAL DATA:  Pain and swelling, no acute injury, initial encounter EXAM: LEFT HAND - COMPLETE 3+ VIEW COMPARISON:  None. FINDINGS: There is no evidence of fracture or dislocation. There is no evidence of arthropathy or other focal bone abnormality. Soft tissues are unremarkable. IMPRESSION: No acute abnormality noted. Electronically Signed   By: Inez Catalina M.D.   On: 05/24/2016 19:44    Assessment & Plan:   Tradarius was seen today for annual exam.  Diagnoses and all orders for this visit:  Hematuria, microscopic- He has persistent, asymptomatic hematuria.  Will recheck the MRI of his kidneys to be certain that there has not been a malignant transformation. -     MR Abdomen W Wo Contrast; Future  Essential hypertension- His blood pressure is  well controlled.  Electrolytes and renal function are normal. -     losartan (COZAAR) 50 MG tablet; Take 1 tablet (50 mg total) daily by mouth. -     CBC with Differential/Platelet; Future -     TSH; Future  BPH associated with nocturia- His PSA remains low so I am not concerned about prostate cancer.  He has no symptoms that need to be treated. -     PSA; Future -     Urinalysis, Routine w reflex microscopic; Future  Bilateral renal masses- Will recheck the MRI. -     MR Abdomen W Wo Contrast; Future  Hyperglycemia- His A1c is 5.8%.  He is mildly prediabetic.  Medical therapy is not indicated. -     Comprehensive metabolic panel; Future -     Hemoglobin A1c; Future  Hyperuricemia -     Uric acid; Future  Mixed hyperlipidemia-he has an elevated ASCVD risk score but to date has not been willing to take a statin.  I have asked him again if he is willing to try 1 of the newer statins that has a lower light side effect profile. -     Lipid panel; Future   I have changed Youlanda Roys. Postel's losartan. I am also having him maintain his vitamin C, TOCOPHEROLS-TOCOTRIENOLS PO, Vitamin D, Calcium-Magnesium (CAL-MAG PO), Melatonin, Probiotic Product (PROBIOTIC DAILY PO), aspirin EC, niacin, TURMERIC PO, CINNAMON PO, halobetasol, and fluorouracil.  Meds ordered this encounter  Medications  . fluorouracil (EFUDEX) 5 % cream    Sig: APPLY DAILY TO FOREHEAD AND SCALP AS DIRECTED    Refill:  0  . losartan (COZAAR) 50 MG tablet    Sig: Take 1 tablet (50 mg total) daily by mouth.    Dispense:  90 tablet    Refill:  3     Follow-up: Return in about 6 months (around 07/19/2017).  Scarlette Calico, MD

## 2017-01-19 NOTE — Patient Instructions (Signed)

## 2017-01-20 ENCOUNTER — Encounter: Payer: Self-pay | Admitting: Internal Medicine

## 2017-01-20 NOTE — Assessment & Plan Note (Signed)

## 2017-02-10 ENCOUNTER — Encounter: Payer: Self-pay | Admitting: Internal Medicine

## 2017-02-10 ENCOUNTER — Ambulatory Visit
Admission: RE | Admit: 2017-02-10 | Discharge: 2017-02-10 | Disposition: A | Discharge status: Home / Self Care | Payer: Medicare Other | Source: Ambulatory Visit | Attending: Internal Medicine | Admitting: Internal Medicine

## 2017-02-10 DIAGNOSIS — R3129 Other microscopic hematuria: Secondary | ICD-10-CM

## 2017-02-10 DIAGNOSIS — N2889 Other specified disorders of kidney and ureter: Secondary | ICD-10-CM

## 2017-02-10 MED ORDER — GADOBENATE DIMEGLUMINE 529 MG/ML IV SOLN
20.0000 mL | Freq: Once | INTRAVENOUS | Status: AC | PRN
  Administered 2017-02-10: 20 mL via INTRAVENOUS

## 2017-04-20 ENCOUNTER — Encounter: Payer: Self-pay | Admitting: Family

## 2017-04-20 ENCOUNTER — Ambulatory Visit: Payer: Medicare Other | Admitting: Family

## 2017-04-20 VITALS — BP 124/82 | HR 84 | Temp 97.9°F | Ht 71.0 in | Wt 205.0 lb

## 2017-04-20 DIAGNOSIS — R040 Epistaxis: Secondary | ICD-10-CM

## 2017-04-20 DIAGNOSIS — J019 Acute sinusitis, unspecified: Secondary | ICD-10-CM | POA: Diagnosis not present

## 2017-04-20 MED ORDER — AZITHROMYCIN 250 MG PO TABS: ORAL_TABLET | ORAL | 0 refills | Status: DC

## 2017-04-20 NOTE — Progress Notes (Signed)
Jeffrey Watkins is a 77 y.o. male with the following history as recorded in [employer]:  Patient Active Problem List   Diagnosis Date Noted  . Chronic hand pain, left 05/24/2016  . Arthritis of carpometacarpal [facility]) joint of left thumb 05/24/2016  . Liver mass 07/01/2015  . Bilateral renal masses 07/01/2015  . BPH associated with nocturia 06/11/2015  . Routine general medical examination at a health care facility 06/11/2015  . Hematuria, microscopic 06/11/2015  . Hyperglycemia 06/06/2014  . Nonspecific abnormal electrocardiogram (ECG) (EKG) 03/24/2011  . Essential hypertension 07/26/2007  . Mixed hyperlipidemia 06/28/2007  . Hyperuricemia 06/28/2007    Current Outpatient Medications  Medication Sig Dispense Refill  . Ascorbic Acid (VITAMIN C) 1000 MG tablet Take 1,000 mg by mouth daily.     Marland Kitchen aspirin EC 81 MG tablet Take 81 mg by mouth daily.    . Calcium-Magnesium (CAL-MAG PO) Take 1 tablet by mouth 2 (two) times daily.    . Cholecalciferol (VITAMIN D) 2000 UNITS CAPS Take 2,000 Units by mouth daily.     . fluorouracil (EFUDEX) 5 % cream APPLY DAILY TO FOREHEAD AND SCALP AS DIRECTED  0  . halobetasol (ULTRAVATE) 0.05 % cream APPLY TO AFFECTED AREA ON SKIN TWICE A DAY  2  . KRILL OIL PO Take by mouth.    . losartan (COZAAR) 50 MG tablet Take 1 tablet (50 mg total) daily by mouth. 90 tablet 3  . Melatonin 3 MG TABS Take 3 mg by mouth at bedtime. 3 mg    . Multiple Vitamins-Minerals (MULTIVITAMIN WITH MINERALS) tablet Take by mouth.    . niacin 500 MG tablet Take 500 mg by mouth 3 (three) times daily.    Marland Kitchen PHYTOSTEROLS PO Take by mouth.    . Probiotic Product (PROBIOTIC DAILY PO) Take 1 tablet by mouth daily with breakfast.     . TOCOPHEROLS-TOCOTRIENOLS PO Take 1 tablet by mouth daily.     Marland Kitchen azithromycin (ZITHROMAX) 250 MG tablet 2 tabs po qd x 1 day; 1 tablet per day x 4 days; 6 tablet 0   No current facility-administered medications for this visit.     Allergies: Statins; Codeine;  and Simvastatin  Past Medical History:  Diagnosis Date  . Chronic kidney disease    noncancerous nodules in kidney   . HTN (hypertension)   . Hydrocele    R scrotum  . Hyperlipidemia   . 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 COLONOSCOPY     neg; Dr Deatra Ina  . INGUINAL HERNIA REPAIR Left 07/11/2012   Procedure: LAPAROSCOPIC INGUINAL HERNIA;  Surgeon: Adin Hector, MD;  Location: [facility];  Service: General;  Laterality: Left;  . INGUINAL HERNIA REPAIR Left 08/26/2015   Procedure: OPEN REPAIR RECURRENT LEFT INGUINAL HERNIA WITH MESH;  Surgeon: Fanny Skates, MD;  Location: [facility];  Service: General;  Laterality: Left;  . INSERTION OF MESH Left 07/11/2012   Procedure: INSERTION OF MESH;  Surgeon: Adin Hector, MD;  Location: [facility];  Service: General;  Laterality: Left;  . LITHOTRIPSY     X 1  . urinary stent  1996 or 1997    Family History  Problem Relation Age of Onset  . Heart attack Mother        MI > 71  . Heart attack Father 10  . Aneurysm Brother 34       AAA  .  Cancer Neg Hx   . Diabetes Neg Hx   . Transient ischemic attack Maternal Grandmother        in late 37s    Social History   Tobacco Use  . Smoking status: Never Smoker  . Smokeless tobacco: Never Used  Substance Use Topics  . Alcohol use: Yes    Alcohol/week: 0.0 oz    Comment:  rarely; 3 glasses / year    Subjective:   Concerned about possible sinus infection; had 2 different episodes of nose bleeding in the past 2 days; leaving in a week for backpack trip- wants to make sure infection is treated completely; + pressure on right side of face; + sore throat; + drainage;  Using OTC Netty Pot; Zyrtec;   Objective:  Vitals:   04/20/17 1433  BP: 124/82  Pulse: 84  Temp: 97.9 F (36.6 C)  TempSrc: Oral  SpO2: 98%  Weight: 205 lb (93 kg)  Height: 5\' 11"  (1.803 m)    General: Well developed, well nourished, in no acute  distress  Skin : Warm and dry.  Head: Normocephalic and atraumatic  Eyes: Sclera and conjunctiva clear; pupils round and reactive to light; extraocular movements intact  Ears: External normal; canals clear; tympanic membranes normal  Oropharynx: Pink, supple. No suspicious lesions; scabbed lesion noted in right nostril Neck: Supple without thyromegaly, adenopathy  Lungs: Respirations unlabored; clear to auscultation bilaterally without wheeze, rales, rhonchi  CVS exam: normal rate and regular rhythm.  Neurologic: Alert and oriented; speech intact; face symmetrical; moves all extremities well; CNII-XII intact without focal deficit   Assessment:  1. Acute sinusitis, recurrence not specified, unspecified location   2. Epistaxis     Plan:  1. Patient asks for Z-pak; has responded well in the past; Rx given as requested; 2. Reassurance; suspect superficial source causing bleeding; encouraged to stop using Netti Pot; try saline nasal mist instead; follow-up worse, no better.   No Follow-up on file.  No orders of the defined types were placed in this encounter.   Requested Prescriptions   Signed Prescriptions Disp Refills  . azithromycin (ZITHROMAX) 250 MG tablet 6 tablet 0    Sig: 2 tabs po qd x 1 day; 1 tablet per day x 4 days;

## 2017-04-27 ENCOUNTER — Ambulatory Visit: Payer: Medicare Other | Admitting: Family

## 2017-04-27 ENCOUNTER — Encounter: Payer: Self-pay | Admitting: Family

## 2017-04-27 VITALS — BP 128/80 | HR 80 | Temp 98.3°F | Ht 71.0 in | Wt 204.0 lb

## 2017-04-27 DIAGNOSIS — R040 Epistaxis: Secondary | ICD-10-CM | POA: Diagnosis not present

## 2017-04-27 MED ORDER — MUPIROCIN CALCIUM 2 % NA OINT: 1.0000 "application " | TOPICAL_OINTMENT | Freq: Two times a day (BID) | NASAL | 0 refills | Status: DC

## 2017-04-28 NOTE — Progress Notes (Signed)
Jeffrey Watkins is a 77 y.o. male with the following history as recorded in [employer]:  Patient Active Problem List   Diagnosis Date Noted  . Chronic hand pain, left 05/24/2016  . Arthritis of carpometacarpal [facility]) joint of left thumb 05/24/2016  . Liver mass 07/01/2015  . Bilateral renal masses 07/01/2015  . BPH associated with nocturia 06/11/2015  . Routine general medical examination at a health care facility 06/11/2015  . Hematuria, microscopic 06/11/2015  . Hyperglycemia 06/06/2014  . Nonspecific abnormal electrocardiogram (ECG) (EKG) 03/24/2011  . Essential hypertension 07/26/2007  . Mixed hyperlipidemia 06/28/2007  . Hyperuricemia 06/28/2007    Current Outpatient Medications  Medication Sig Dispense Refill  . Ascorbic Acid (VITAMIN C) 1000 MG tablet Take 1,000 mg by mouth daily.     Marland Kitchen aspirin EC 81 MG tablet Take 81 mg by mouth daily.    Marland Kitchen azithromycin (ZITHROMAX) 250 MG tablet 2 tabs po qd x 1 day; 1 tablet per day x 4 days; 6 tablet 0  . Calcium-Magnesium (CAL-MAG PO) Take 1 tablet by mouth 2 (two) times daily.    . Cholecalciferol (VITAMIN D) 2000 UNITS CAPS Take 2,000 Units by mouth daily.     . fluorouracil (EFUDEX) 5 % cream APPLY DAILY TO FOREHEAD AND SCALP AS DIRECTED  0  . halobetasol (ULTRAVATE) 0.05 % cream APPLY TO AFFECTED AREA ON SKIN TWICE A DAY  2  . KRILL OIL PO Take by mouth.    . losartan (COZAAR) 50 MG tablet Take 1 tablet (50 mg total) daily by mouth. 90 tablet 3  . Melatonin 3 MG TABS Take 3 mg by mouth at bedtime. 3 mg    . Multiple Vitamins-Minerals (MULTIVITAMIN WITH MINERALS) tablet Take by mouth.    . niacin 500 MG tablet Take 500 mg by mouth 3 (three) times daily.    Marland Kitchen PHYTOSTEROLS PO Take by mouth.    . Probiotic Product (PROBIOTIC DAILY PO) Take 1 tablet by mouth daily with breakfast.     . TOCOPHEROLS-TOCOTRIENOLS PO Take 1 tablet by mouth daily.     . mupirocin nasal ointment (BACTROBAN NASAL) 2 % Place 1 application into the nose 2 (two) times  daily. Use one-half of tube in each nostril twice daily for five (5) days. 10 g 0   No current facility-administered medications for this visit.     Allergies: Statins; Codeine; and Simvastatin  Past Medical History:  Diagnosis Date  . Chronic kidney disease    noncancerous nodules in kidney   . HTN (hypertension)   . Hydrocele    R scrotum  . Hyperlipidemia   . 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 COLONOSCOPY     neg; Dr Deatra Ina  . INGUINAL HERNIA REPAIR Left 07/11/2012   Procedure: LAPAROSCOPIC INGUINAL HERNIA;  Surgeon: Adin Hector, MD;  Location: [facility];  Service: General;  Laterality: Left;  . INGUINAL HERNIA REPAIR Left 08/26/2015   Procedure: OPEN REPAIR RECURRENT LEFT INGUINAL HERNIA WITH MESH;  Surgeon: Fanny Skates, MD;  Location: [facility];  Service: General;  Laterality: Left;  . INSERTION OF MESH Left 07/11/2012   Procedure: INSERTION OF MESH;  Surgeon: Adin Hector, MD;  Location: [facility];  Service: General;  Laterality: Left;  . LITHOTRIPSY     X 1  . urinary stent  1996 or 1997    Family History  Problem Relation Age  of Onset  . Heart attack Mother        MI > 87  . Heart attack Father 35  . Aneurysm Brother 82       AAA  . Cancer Neg Hx   . Diabetes Neg Hx   . Transient ischemic attack Maternal Grandmother        in late 28s    Social History   Tobacco Use  . Smoking status: Never Smoker  . Smokeless tobacco: Never Used  Substance Use Topics  . Alcohol use: Yes    Alcohol/week: 0.0 oz    Comment:  rarely; 3 glasses / year    Subjective:  Seen last week with concerns for sinus infection with upcoming backpacking trip to [place]; will be leaving for that trip tomorrow; responded very well to Z-pak and saline rinse; had not had problems with nose bleeds again until this morning; now feels there are "sores" inside his nose. Wife accompanies him and asks about getting Rx for  Bactroban nasal ointment;   Objective:  Vitals:   04/27/17 1418  BP: 128/80  Pulse: 80  Temp: 98.3 F (36.8 C)  TempSrc: Oral  SpO2: 98%  Weight: 204 lb 0.6 oz (92.6 kg)  Height: 5\' 11"  (1.803 m)    General: Well developed, well nourished, in no acute distress  Skin : Warm and dry.  Head: Normocephalic and atraumatic  Eyes: Sclera and conjunctiva clear; pupils round and reactive to light; extraocular movements intact  Oropharynx: Pink, supple. No suspicious lesions; sores noted in outer right nare Neck: Supple without thyromegaly, adenopathy  Lungs: Respirations unlabored; clear to auscultation bilaterally without wheeze, rales, Vessels: Symmetric bilaterally  Neurologic: Alert and oriented; speech intact; face symmetrical; moves all extremities well; CNII-XII intact without focal deficit  Assessment:  1. Bleeding nose     Plan:  Agree with trial of Bactroban nasal ointment; am concerned about recurrent nose bleeds with upcoming primitive backpacking trip; will try to get him to his ENT before he leaves on Friday to make sure he does not need to have capillaries in nostril cauterized;   No Follow-up on file.  Orders Placed This Encounter  Procedures  . Ambulatory referral to ENT    Referral Priority:   Routine    Referral Type:   Consultation    Referral Reason:   Specialty Services Required    Referred to Provider:   Izora Gala, MD    Requested Specialty:   Otolaryngology    Number of Visits Requested:   1    Requested Prescriptions   Signed Prescriptions Disp Refills  . mupirocin nasal ointment (BACTROBAN NASAL) 2 % 10 g 0    Sig: Place 1 application into the nose 2 (two) times daily. Use one-half of tube in each nostril twice daily for five (5) days.

## 2017-07-06 ENCOUNTER — Telehealth: Payer: Self-pay | Admitting: Internal Medicine

## 2017-07-06 NOTE — Telephone Encounter (Signed)
OK 

## 2017-07-08 NOTE — Telephone Encounter (Signed)
Spoke w/patient and will call back to schedule

## 2017-08-10 ENCOUNTER — Telehealth: Payer: Self-pay | Admitting: Emergency Medicine

## 2017-08-10 NOTE — Telephone Encounter (Signed)
Called patient to schedule AWV. Patient will call back at later date to schedule. 

## 2017-12-22 ENCOUNTER — Ambulatory Visit (INDEPENDENT_AMBULATORY_CARE_PROVIDER_SITE_OTHER): Payer: Medicare Other | Admitting: *Deleted

## 2017-12-22 DIAGNOSIS — Z23 Encounter for immunization: Secondary | ICD-10-CM | POA: Diagnosis not present

## 2018-01-03 ENCOUNTER — Ambulatory Visit (INDEPENDENT_AMBULATORY_CARE_PROVIDER_SITE_OTHER): Payer: Medicare Other | Admitting: Internal Medicine

## 2018-01-03 ENCOUNTER — Encounter: Payer: Self-pay | Admitting: Internal Medicine

## 2018-01-03 ENCOUNTER — Other Ambulatory Visit (INDEPENDENT_AMBULATORY_CARE_PROVIDER_SITE_OTHER): Payer: Medicare Other

## 2018-01-03 VITALS — BP 146/90 | HR 83 | Temp 97.9°F | Resp 16 | Ht 71.0 in | Wt 203.8 lb

## 2018-01-03 DIAGNOSIS — I10 Essential (primary) hypertension: Secondary | ICD-10-CM

## 2018-01-03 DIAGNOSIS — N401 Enlarged prostate with lower urinary tract symptoms: Secondary | ICD-10-CM

## 2018-01-03 DIAGNOSIS — R351 Nocturia: Secondary | ICD-10-CM

## 2018-01-03 DIAGNOSIS — Z23 Encounter for immunization: Secondary | ICD-10-CM

## 2018-01-03 DIAGNOSIS — E790 Hyperuricemia without signs of inflammatory arthritis and tophaceous disease: Secondary | ICD-10-CM | POA: Diagnosis not present

## 2018-01-03 DIAGNOSIS — R160 Hepatomegaly, not elsewhere classified: Secondary | ICD-10-CM | POA: Diagnosis not present

## 2018-01-03 DIAGNOSIS — E782 Mixed hyperlipidemia: Secondary | ICD-10-CM

## 2018-01-03 DIAGNOSIS — Z0001 Encounter for general adult medical examination with abnormal findings: Secondary | ICD-10-CM

## 2018-01-03 DIAGNOSIS — Z0000 Encounter for general adult medical examination without abnormal findings: Secondary | ICD-10-CM

## 2018-01-03 DIAGNOSIS — R3129 Other microscopic hematuria: Secondary | ICD-10-CM

## 2018-01-03 LAB — CBC WITH DIFFERENTIAL/PLATELET
BASOS PCT: 0.3 % (ref 0.0–3.0)
Basophils Absolute: 0 10*3/uL (ref 0.0–0.1)
EOS PCT: 3.6 % (ref 0.0–5.0)
Eosinophils Absolute: 0.3 10*3/uL (ref 0.0–0.7)
HCT: 45.1 % (ref 39.0–52.0)
Hemoglobin: 15.5 g/dL (ref 13.0–17.0)
Lymphocytes Relative: 26.4 % (ref 12.0–46.0)
Lymphs Abs: 2.3 10*3/uL (ref 0.7–4.0)
MCHC: 34.4 g/dL (ref 30.0–36.0)
MCV: 93.3 fl (ref 78.0–100.0)
MONOS PCT: 9.8 % (ref 3.0–12.0)
Monocytes Absolute: 0.9 10*3/uL (ref 0.1–1.0)
NEUTROS ABS: 5.3 10*3/uL (ref 1.4–7.7)
NEUTROS PCT: 59.9 % (ref 43.0–77.0)
PLATELETS: 202 10*3/uL (ref 150.0–400.0)
RBC: 4.83 Mil/uL (ref 4.22–5.81)
RDW: 13.4 % (ref 11.5–15.5)
WBC: 8.8 10*3/uL (ref 4.0–10.5)

## 2018-01-03 LAB — URINALYSIS, ROUTINE W REFLEX MICROSCOPIC
Bilirubin Urine: NEGATIVE
KETONES UR: NEGATIVE
Leukocytes, UA: NEGATIVE
NITRITE: NEGATIVE
Specific Gravity, Urine: 1.015 (ref 1.000–1.030)
Total Protein, Urine: NEGATIVE
URINE GLUCOSE: NEGATIVE
Urobilinogen, UA: 0.2 (ref 0.0–1.0)
WBC, UA: NONE SEEN (ref 0–?)
pH: 6 (ref 5.0–8.0)

## 2018-01-03 LAB — LIPID PANEL
CHOLESTEROL: 236 mg/dL — AB (ref 0–200)
HDL: 58 mg/dL (ref >39.00)
LDL Cholesterol: 142 mg/dL — ABNORMAL HIGH (ref 0–99)
NonHDL: 177.69
TRIGLYCERIDES: 178 mg/dL — AB (ref 0.0–149.0)
Total CHOL/HDL Ratio: 4
VLDL: 35.6 mg/dL (ref 0.0–40.0)

## 2018-01-03 LAB — COMPREHENSIVE METABOLIC PANEL
ALK PHOS: 46 U/L (ref 39–117)
ALT: 27 U/L (ref 0–53)
AST: 21 U/L (ref 0–37)
Albumin: 4.7 g/dL (ref 3.5–5.2)
BILIRUBIN TOTAL: 0.7 mg/dL (ref 0.2–1.2)
BUN: 16 mg/dL (ref 6–23)
CALCIUM: 10.2 mg/dL (ref 8.4–10.5)
CO2: 29 meq/L (ref 19–32)
Chloride: 101 mEq/L (ref 96–112)
Creatinine, Ser: 1.14 mg/dL (ref 0.40–1.50)
GFR: 66.15 mL/min (ref >60.00)
Glucose, Bld: 96 mg/dL (ref 70–99)
Potassium: 4.5 mEq/L (ref 3.5–5.1)
Sodium: 137 mEq/L (ref 135–145)
TOTAL PROTEIN: 6.9 g/dL (ref 6.0–8.3)

## 2018-01-03 LAB — TSH: TSH: 1.83 u[IU]/mL (ref 0.35–4.50)

## 2018-01-03 LAB — URIC ACID: URIC ACID, SERUM: 7.3 mg/dL (ref 4.0–7.8)

## 2018-01-03 LAB — PSA: PSA: 1.3 ng/mL (ref 0.10–4.00)

## 2018-01-03 LAB — VITAMIN D 25 HYDROXY (VIT D DEFICIENCY, FRACTURES): VITD: 61.97 ng/mL (ref 30.00–100.00)

## 2018-01-03 MED ORDER — AZILSARTAN MEDOXOMIL 80 MG PO TABS
1.0000 | ORAL_TABLET | Freq: Every day | ORAL | 1 refills | Status: DC
Start: 2018-01-03 — End: 2018-07-04

## 2018-01-03 NOTE — Progress Notes (Signed)
Subjective:  Patient ID: Jeffrey Watkins, male    DOB: [birth year]  Age: 77 y.o. MRN: [id number]  CC: Hypertension; Hyperlipidemia; and Annual Exam   HPI Jeffrey Watkins presents for f/up - He does not monitor his blood pressure.  He denies any recent episodes of headache, blurred vision, CP, DOE, palpitations, edema, or fatigue.  Past Medical History:  Diagnosis Date  . Chronic kidney disease    noncancerous nodules in kidney   . HTN (hypertension)   . Hydrocele    R scrotum  . Hyperlipidemia   . 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 COLONOSCOPY     neg; Dr Deatra Ina  . INGUINAL HERNIA REPAIR Left 07/11/2012   Procedure: LAPAROSCOPIC INGUINAL HERNIA;  Surgeon: Adin Hector, MD;  Location: [facility];  Service: General;  Laterality: Left;  . INGUINAL HERNIA REPAIR Left 08/26/2015   Procedure: OPEN REPAIR RECURRENT LEFT INGUINAL HERNIA WITH MESH;  Surgeon: Fanny Skates, MD;  Location: [facility];  Service: General;  Laterality: Left;  . INSERTION OF MESH Left 07/11/2012   Procedure: INSERTION OF MESH;  Surgeon: Adin Hector, MD;  Location: [facility];  Service: General;  Laterality: Left;  . LITHOTRIPSY     X 1  . urinary stent  1996 or 1997    reports that he has never smoked. He has never used smokeless tobacco. He reports that he drinks alcohol. He reports that he does not use drugs. family history includes Aneurysm (age of onset: 57) in his brother; Heart attack in his mother; Heart attack (age of onset: 48) in his father; Transient ischemic attack in his maternal grandmother. Allergies  Allergen Reactions  . Statins Other (See Comments)    Muscle aches  . Codeine Nausea And Vomiting  . Simvastatin Other (See Comments)    MUSCLE ACHES    Outpatient Medications Prior to Visit  Medication Sig Dispense Refill  . aspirin EC 81 MG tablet Take 81 mg by mouth daily.    . Calcium-Magnesium (CAL-MAG PO) Take 1  tablet by mouth 2 (two) times daily.    . Cholecalciferol (VITAMIN D) 2000 UNITS CAPS Take 2,000 Units by mouth daily.     . fluorouracil (EFUDEX) 5 % cream APPLY DAILY TO FOREHEAD AND SCALP AS DIRECTED  0  . halobetasol (ULTRAVATE) 0.05 % cream APPLY TO AFFECTED AREA ON SKIN TWICE A DAY  2  . KRILL OIL PO Take by mouth.    . Melatonin 3 MG TABS Take 3 mg by mouth at bedtime. 3 mg    . Multiple Vitamins-Minerals (MULTIVITAMIN WITH MINERALS) tablet Take by mouth.    . mupirocin nasal ointment (BACTROBAN NASAL) 2 % Place 1 application into the nose 2 (two) times daily. Use one-half of tube in each nostril twice daily for five (5) days. 10 g 0  . niacin 500 MG tablet Take 500 mg by mouth 3 (three) times daily.    Marland Kitchen losartan (COZAAR) 50 MG tablet Take 1 tablet (50 mg total) daily by mouth. 90 tablet 3  . Ascorbic Acid (VITAMIN C) 1000 MG tablet Take 1,000 mg by mouth daily.     Marland Kitchen azithromycin (ZITHROMAX) 250 MG tablet 2 tabs po qd x 1 day; 1 tablet per day x 4 days; 6 tablet 0  . PHYTOSTEROLS PO Take by mouth.    . Probiotic Product (PROBIOTIC DAILY PO) Take 1  tablet by mouth daily with breakfast.     . TOCOPHEROLS-TOCOTRIENOLS PO Take 1 tablet by mouth daily.      No facility-administered medications prior to visit.     ROS Review of Systems  Constitutional: Negative for appetite change, diaphoresis and fatigue.  HENT: Negative.   Eyes: Negative.  Negative for visual disturbance.  Respiratory: Negative for cough, chest tightness, shortness of breath and wheezing.   Cardiovascular: Negative for chest pain, palpitations and leg swelling.  Gastrointestinal: Negative for abdominal pain, constipation, diarrhea, nausea and vomiting.  Endocrine: Negative.   Genitourinary: Negative.  Negative for difficulty urinating and dysuria.  Musculoskeletal: Negative.  Negative for arthralgias, back pain, myalgias and neck pain.  Skin: Negative.   Neurological: Negative.  Negative for dizziness, weakness,  light-headedness and headaches.  Hematological: Negative for adenopathy. Does not bruise/bleed easily.  Psychiatric/Behavioral: Negative.     Objective:  BP (!) 146/90 (BP Location: Left Arm, Patient Position: Sitting, Cuff Size: Normal)   Pulse 83   Temp 97.9 F (36.6 C) (Oral)   Resp 16   Ht 5\' 11"  (1.803 m)   Wt 203 lb 12 oz (92.4 kg)   SpO2 97%   BMI 28.42 kg/m   BP Readings from Last 3 Encounters:  01/03/18 (!) 146/90  04/27/17 128/80  04/20/17 124/82    Wt Readings from Last 3 Encounters:  01/03/18 203 lb 12 oz (92.4 kg)  04/27/17 204 lb 0.6 oz (92.6 kg)  04/20/17 205 lb (93 kg)    Physical Exam  Constitutional: He is oriented to person, place, and time. No distress.  HENT:  Mouth/Throat: Oropharynx is clear and moist. No oropharyngeal exudate.  Eyes: Conjunctivae are normal. No scleral icterus.  Neck: Normal range of motion. Neck supple. No JVD present. No thyromegaly present.  Cardiovascular: Normal rate, regular rhythm and normal heart sounds. Exam reveals no gallop.  No murmur heard. Pulmonary/Chest: Effort normal and breath sounds normal. No respiratory distress. He has no wheezes. He has no rhonchi. He has no rales.  Abdominal: Soft. Normal appearance and bowel sounds are normal. He exhibits no mass. There is no hepatosplenomegaly. There is no tenderness.  Genitourinary:  Genitourinary Comments: GU and rectal exams were deferred at his request.  Musculoskeletal: Normal range of motion. He exhibits no edema, tenderness or deformity.  Lymphadenopathy:    He has no cervical adenopathy.  Neurological: He is alert and oriented to person, place, and time.  Skin: Skin is warm and dry. No rash noted. He is not diaphoretic.  Vitals reviewed.   Lab Results  Component Value Date   WBC 8.8 01/03/2018   HGB 15.5 01/03/2018   HCT 45.1 01/03/2018   PLT 202.0 01/03/2018   GLUCOSE 96 01/03/2018   CHOL 236 (H) 01/03/2018   TRIG 178.0 (H) 01/03/2018   HDL 58.00  01/03/2018   LDLDIRECT 138.8 04/17/2012   LDLCALC 142 (H) 01/03/2018   ALT 27 01/03/2018   AST 21 01/03/2018   NA 137 01/03/2018   K 4.5 01/03/2018   CL 101 01/03/2018   CREATININE 1.14 01/03/2018   BUN 16 01/03/2018   CO2 29 01/03/2018   TSH 1.83 01/03/2018   PSA 1.30 01/03/2018   HGBA1C 5.8 01/19/2017   MICROALBUR 0.7 02/22/2006    Mr Abdomen W Wo Contrast  Result Date: 02/10/2017 CLINICAL DATA:  Followup indeterminate left renal cystic lesion. EXAM: MRI ABDOMEN WITHOUT AND WITH CONTRAST TECHNIQUE: Multiplanar multisequence MR imaging of the abdomen was performed both before and after  the administration of intravenous contrast. CONTRAST:  59mL MULTIHANCE GADOBENATE DIMEGLUMINE 529 MG/ML IV SOLN COMPARISON:  02/03/2016 and 07/10/2015 FINDINGS: Lower chest: No acute findings. Hepatobiliary: Stable 1.6 cm benign hemangioma in the right hepatic lobe. No other liver masses identified. Gallbladder is unremarkable. No evidence of biliary duct dilatation. Pancreas: No mass or inflammatory changes. Pancreas divisum again noted. No evidence of pancreatic ductal dilatation. Stable tiny scattered less than 5 mm cystic foci within the pancreas, consistent with benign etiology. Spleen:  Within normal limits in size and appearance. Adrenals/Urinary Tract: Stable benign simple right renal cysts. A 1.7 cm subcapsular lesion in the anterior lower pole of the left kidney remains stable compared to previous studies. This lesion shows T1 and T2 hyperintensity and no evidence of contrast enhancement on subtraction imaging, consistent with benign Bosniak category 2 hemorrhagic cyst. A few tiny sub-cm left renal cysts also noted. No evidence of hydronephrosis. Stomach/Bowel: Visualized portions within abdomen are unremarkable. Vascular/Lymphatic: No pathologically enlarged lymph nodes identified. No abdominal aortic aneurysm. Other:  None. Musculoskeletal:  No suspicious bone lesions identified. 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 Other simple renal cysts are also stable. No evidence of renal neoplasm. Stable small benign hepatic hemangioma. Electronically Signed   By: Earle Gell M.D.   On: 02/10/2017 10:22    Assessment & Plan:   Ladarius was seen today for hypertension, hyperlipidemia and annual exam.  Diagnoses and all orders for this visit:  Essential hypertension- His blood pressure is not adequately well controlled.  His labs are negative for secondary causes or endorgan damage.  I have asked him to upgrade to a more potent ARB. -     Azilsartan Medoxomil (EDARBI) 80 MG TABS; Take 1 tablet (80 mg total) by mouth daily. -     CBC with Differential/Platelet; Future -     TSH; Future -     Urinalysis, Routine w reflex microscopic; Future -     VITAMIN D 25 Hydroxy (Vit-D Deficiency, Fractures); Future  Liver mass- His LFTs are normal.  Studies have indicated that this is a benign lesion. -     Comprehensive metabolic panel; Future  Hyperuricemia- His uric acid level is relatively normal at 7.3.  He is not having gout attack so I do not think he needs to take a xanthine oxidase inhibitor. -     Uric acid; Future  BPH associated with nocturia- His PSA is normal which is reassuring that he does not have prostate cancer.  He does not have any symptoms that need to be treated. -     PSA; Future  Hematuria, microscopic  Mixed hyperlipidemia- He has an elevated ASCVD risk score but is not willing to take a statin for CV risk reduction. -     Lipid panel; Future  Need for Tdap vaccination -     Tdap vaccine greater than or equal to 7yo IM   I have discontinued Youlanda Roys. Standing's vitamin C, TOCOPHEROLS-TOCOTRIENOLS PO, Probiotic Product (PROBIOTIC DAILY PO), losartan, PHYTOSTEROLS PO, and azithromycin. I am also having him start on Azilsartan Medoxomil. Additionally, I am having him maintain his Vitamin D, Calcium-Magnesium (CAL-MAG PO), Melatonin, aspirin EC,  niacin, halobetasol, fluorouracil, multivitamin with minerals, KRILL OIL PO, and mupirocin nasal ointment.  Meds ordered this encounter  Medications  . Azilsartan Medoxomil (EDARBI) 80 MG TABS    Sig: Take 1 tablet (80 mg total) by mouth daily.    Dispense:  90 tablet  Refill:  1   See AVS for instructions about healthy living and anticipatory guidance.  Follow-up: Return in about 6 months (around 07/05/2018).  Scarlette Calico, MD

## 2018-01-03 NOTE — Patient Instructions (Signed)

## 2018-01-05 NOTE — Assessment & Plan Note (Signed)

## 2018-01-20 ENCOUNTER — Telehealth: Payer: Self-pay | Admitting: Internal Medicine

## 2018-01-20 DIAGNOSIS — I10 Essential (primary) hypertension: Secondary | ICD-10-CM

## 2018-01-20 MED ORDER — LOSARTAN POTASSIUM 25 MG PO TABS: 50.0000 mg | ORAL_TABLET | Freq: Every day | ORAL | 0 refills | Status: DC

## 2018-01-20 NOTE — Addendum Note (Signed)
Addended by: Karle Barr on: 01/20/2018 03:01 PM   Modules accepted: Orders

## 2018-01-20 NOTE — Telephone Encounter (Signed)
Pharmacy is calling for permission to substitute 2/ 25mg  tab for 1/50 mg tab- that is on backorder. Need PCP order to change Rx.

## 2018-01-20 NOTE — Telephone Encounter (Signed)
[facility] sent for 25mg  losartan 2 tablets (50mg ) daily. #60 sent as verbal order requiring cosign.

## 2018-01-20 NOTE — Telephone Encounter (Signed)
Jeffrey Watkins intern is calling the losartan 50 mg is on back order however they have 25 mg in stock.

## 2018-02-19 IMAGING — MR MR ABDOMEN WO/W CM
19 of 20 series · 47 of 48 positions shown · IV contrast (Multihance 19CC)
Comparison: 02/03/2016 and 07/10/2015

CLINICAL DATA: Followup indeterminate left renal cystic lesion.

EXAM:
MRI ABDOMEN WITHOUT AND WITH CONTRAST
TECHNIQUE: Multiplanar multisequence MR imaging of the abdomen was performed
both before and after the administration of intravenous contrast.
CONTRAST:  20mL MULTIHANCE GADOBENATE DIMEGLUMINE 529 MG/ML IV SOLN

[Series 3: T2 · coronal · 5.0mm · 1.56mm/px · 1 of 42 slices shown (1 of 4)]
[im 1/42]
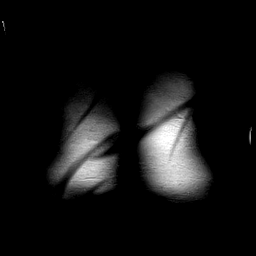

[Series 4: T1 · axial · 3.0mm · 1.25mm/px · z∈[-138,+147]mm · 5 of 192 slices shown]
[im 1/192]
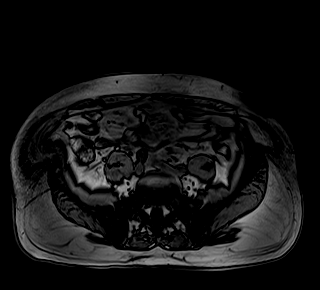
[im 48/192]
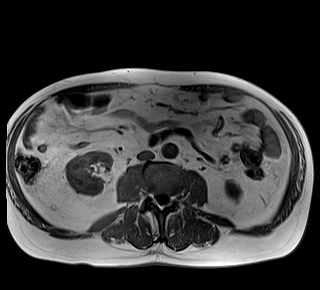
[im 96/192]
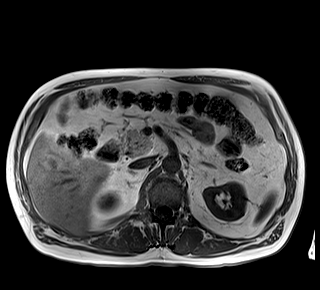
[im 144/192]
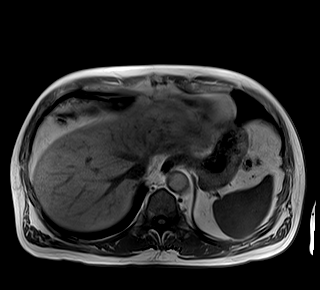
[im 192/192]
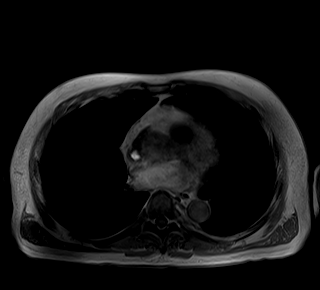

[Series 5: T2 · axial · 5.0mm · 1.48mm/px · 1 of 7 slices shown (2 of 4)]
[im 1/7]
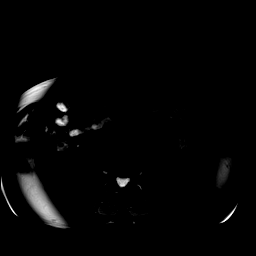

[Series 6: T2 · coronal · 3.0mm · 1.25mm/px · 1 of 19 slices shown (3 of 4)]
[im 1/19]
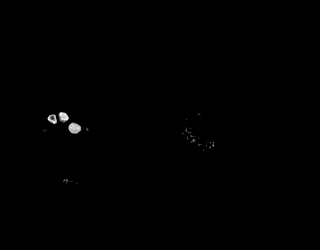

[Series 8: MRCP · coronal · 1.0mm · 0.49mm/px · 1 of 64 slices shown]
[im 1/64]
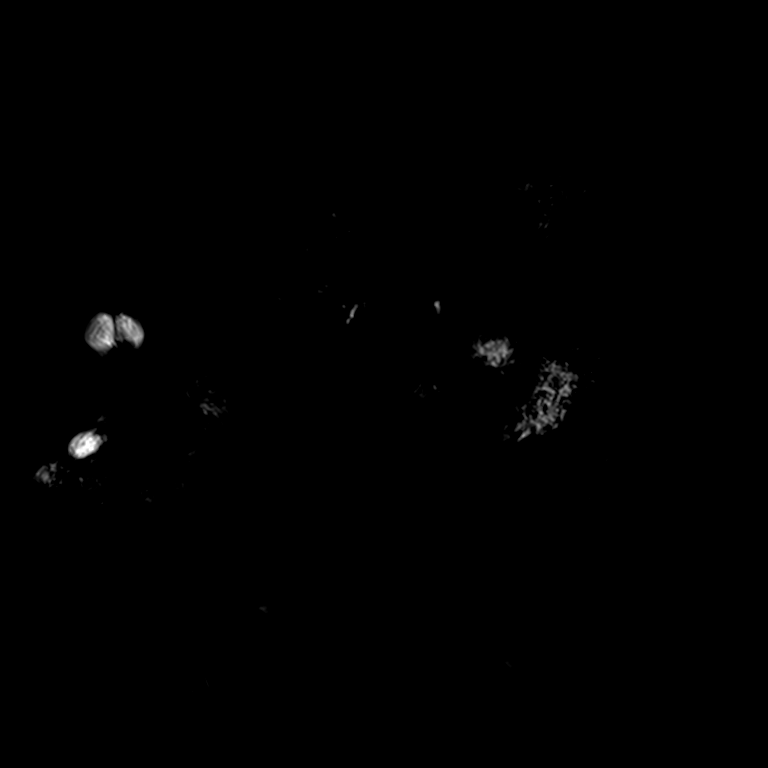

[Series 10: DWI · axial · 5.0mm · 1.49mm/px · z∈[-136,+152]mm · 4 of 147 slices shown (1 of 2)]
[im 1/147]
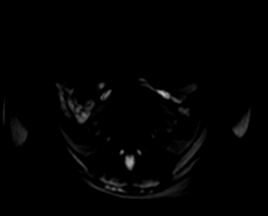
[im 49/147]
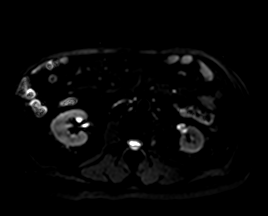
[im 98/147]
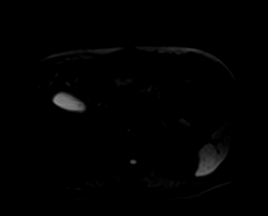
[im 147/147]
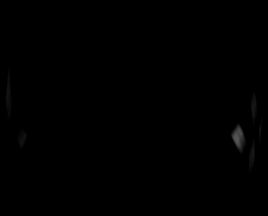

[Series 11: DWI · axial · 5.0mm · 1.49mm/px · z∈[-136,+152]mm · 2 of 49 slices shown (2 of 2)]
[im 1/49]
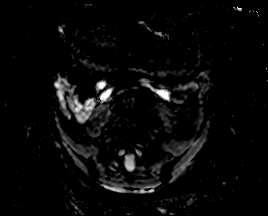
[im 49/49]
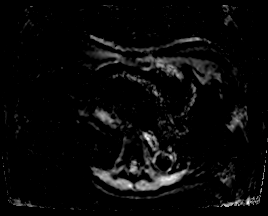

[Series 12: T2 · axial · 6.0mm · 1.25mm/px · 1 of 40 slices shown (4 of 4)]
[im 1/40]
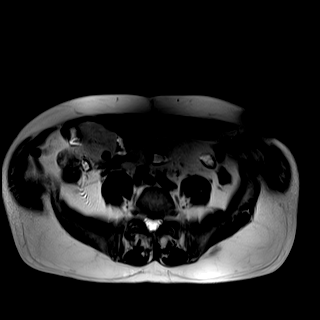

[Series 13: bSSFP · axial · 6.0mm · 1.25mm/px · 1 of 40 slices shown]
[im 1/40]
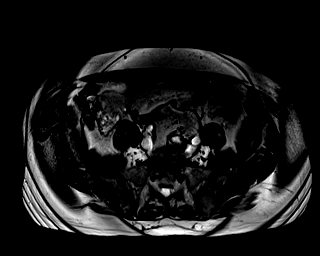

[Series 14: T1 dynamic · axial · non-contrast · 3.0mm · 1.25mm/px · z∈[-137,+148]mm · 3 of 96 slices shown]
[im 1/96]
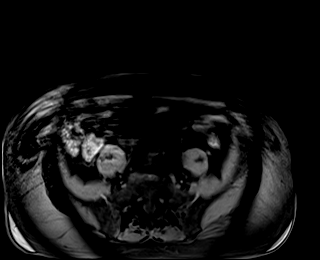
[im 48/96]
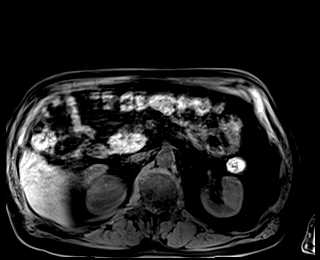
[im 96/96]
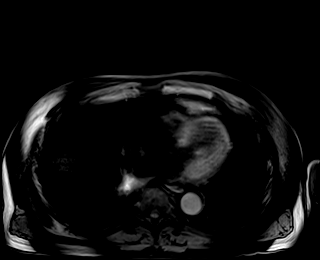

[Series 15: T1 dynamic post-contrast · axial · 3.0mm · 1.25mm/px · z∈[-137,+148]mm · 3 of 96 slices shown (1 of 9)]
[im 1/96]
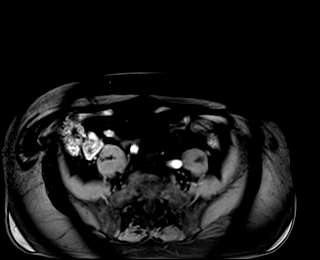
[im 48/96]
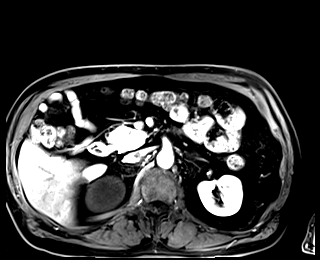
[im 96/96]
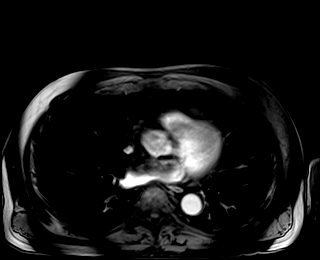

[Series 16: T1 dynamic post-contrast · axial · 3.0mm · 1.25mm/px · z∈[-137,+148]mm · 3 of 96 slices shown (2 of 9)]
[im 1/96]
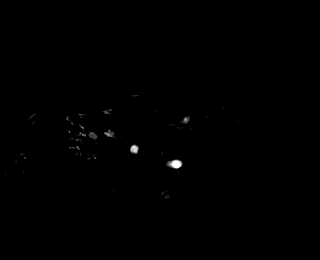
[im 48/96]
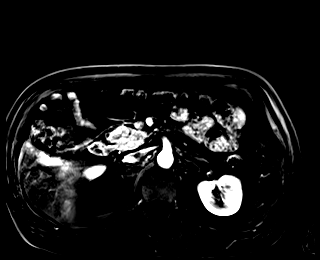
[im 96/96]
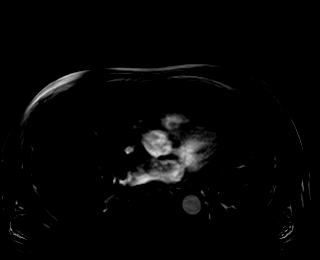

[Series 17: T1 dynamic post-contrast · axial · 3.0mm · 1.25mm/px · z∈[-137,+148]mm · 3 of 96 slices shown (3 of 9)]
[im 1/96]
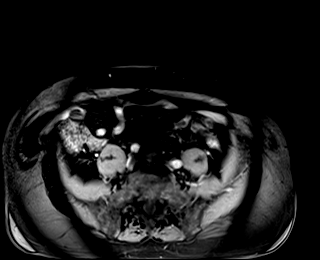
[im 48/96]
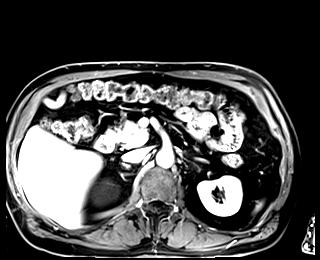
[im 96/96]
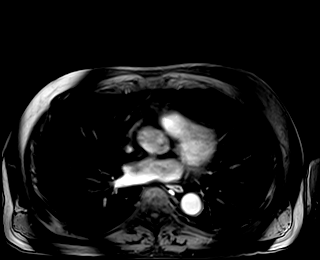

[Series 18: T1 dynamic post-contrast · axial · 3.0mm · 1.25mm/px · z∈[-137,+148]mm · 3 of 96 slices shown (4 of 9)]
[im 1/96]
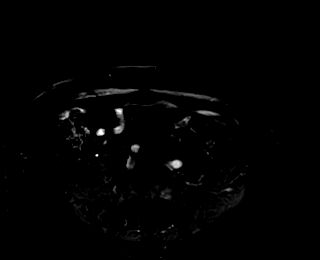
[im 48/96]
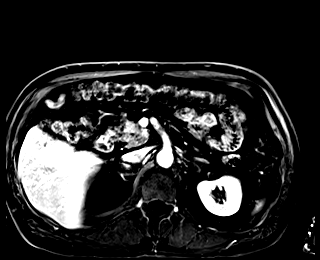
[im 96/96]
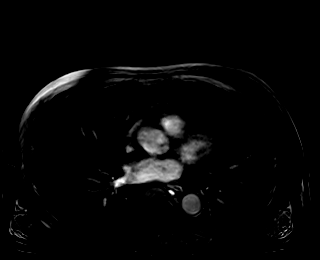

[Series 19: T1 dynamic post-contrast · axial · 3.0mm · 1.25mm/px · z∈[-137,+148]mm · 3 of 96 slices shown (5 of 9)]
[im 1/96]
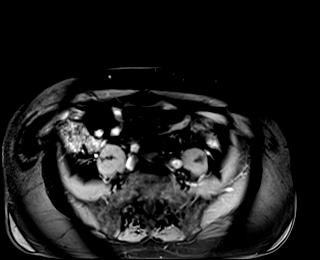
[im 48/96]
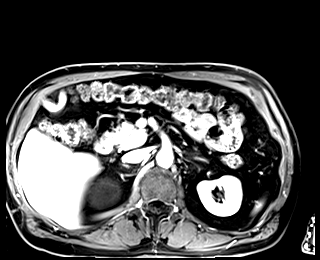
[im 96/96]
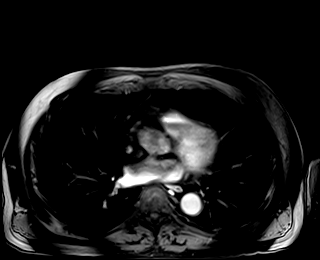

[Series 20: T1 dynamic post-contrast · axial · 3.0mm · 1.25mm/px · z∈[-137,+148]mm · 3 of 96 slices shown (6 of 9)]
[im 1/96]
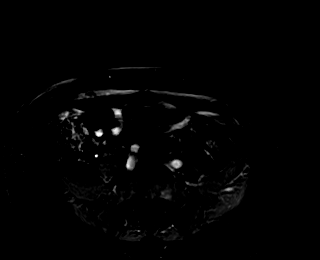
[im 48/96]
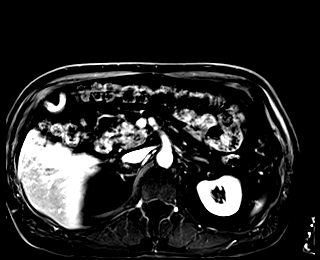
[im 96/96]
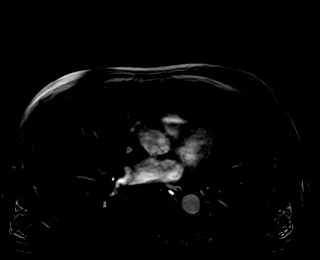

[Series 21: T1 dynamic post-contrast · coronal · 3.0mm · 1.25mm/px · 3 of 88 slices shown (7 of 9)]
[im 1/88]
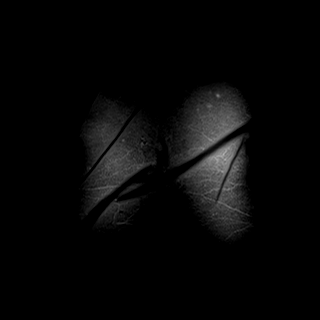
[im 44/88]
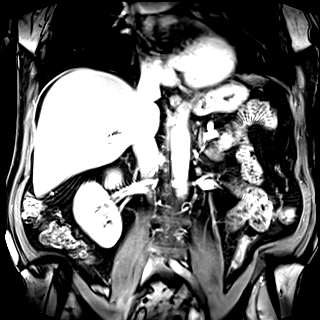
[im 88/88]
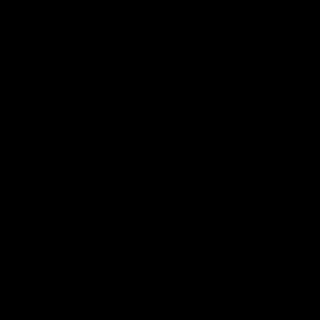

[Series 22: T1 dynamic post-contrast · axial · 3.0mm · 1.25mm/px · z∈[-137,+148]mm · 3 of 96 slices shown (8 of 9)]
[im 1/96]
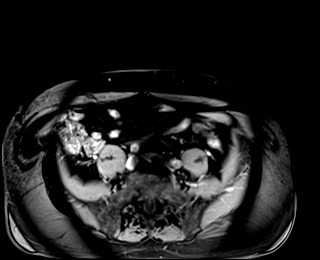
[im 48/96]
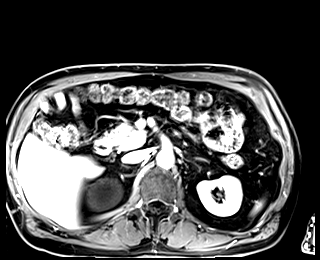
[im 96/96]
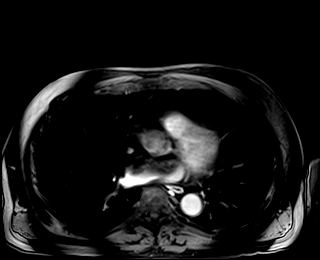

[Series 23: T1 dynamic post-contrast · axial · 3.0mm · 1.25mm/px · z∈[-137,+148]mm · 3 of 96 slices shown (9 of 9)]
[im 1/96]
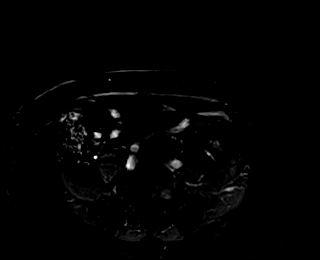
[im 48/96]
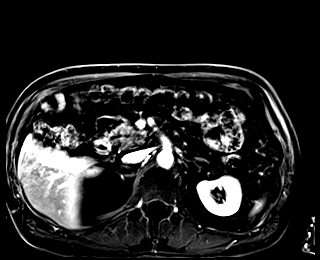
[im 96/96]
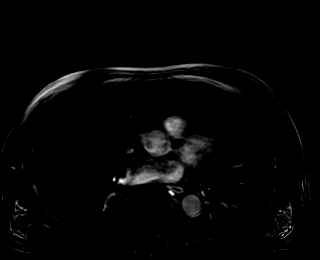

[47 of 48 positions shown; findings below may reference images not displayed]

FINDINGS: Lower chest: No acute findings.

Hepatobiliary: Stable 1.6 cm benign hemangioma in the right hepatic
lobe. No other liver masses identified. Gallbladder is unremarkable.
No evidence of biliary duct dilatation.

Pancreas: No mass or inflammatory changes. Pancreas divisum again
noted. No evidence of pancreatic ductal dilatation. Stable tiny
scattered less than 5 mm cystic foci within the pancreas, consistent
with benign etiology.

Spleen:  Within normal limits in size and appearance.

Adrenals/Urinary Tract: Stable benign simple right renal cysts. A
1.7 cm subcapsular lesion in the anterior lower pole of the left
kidney remains stable compared to previous studies. This lesion
shows T1 and T2 hyperintensity and no evidence of contrast
enhancement on subtraction imaging, consistent with benign Bosniak
category 2 hemorrhagic cyst. A few tiny sub-cm left renal cysts also
noted. No evidence of hydronephrosis.

Stomach/Bowel: Visualized portions within abdomen are unremarkable.

Vascular/Lymphatic: No pathologically enlarged lymph nodes
identified. No abdominal aortic aneurysm.

Other:  None.

Musculoskeletal:  No suspicious bone lesions identified.
IMPRESSION: Stable benign Bosniak category 2 hemorrhagic cyst in left kidney.
Other simple renal cysts are also stable. No evidence of renal
neoplasm.

Stable small benign hepatic hemangioma.

## 2018-04-21 ENCOUNTER — Encounter: Payer: Self-pay | Admitting: Internal Medicine

## 2018-05-24 ENCOUNTER — Telehealth: Payer: Self-pay | Admitting: *Deleted

## 2018-05-24 NOTE — Telephone Encounter (Signed)
Covid-19 travel screening questions  Have you traveled in the last 14 days? No If yes where?   Do you now or have you had a fever in the last 14 days? No  Do you have any respiratory symptoms of shortness of breath or cough now or in the last 14 days? No  Do you have a medical history of Congestive Heart Failure? No  Do you have a medical history of lung disease? No  Do you have any family members or close contacts with diagnosed or suspected Covid-19? No        

## 2018-05-25 ENCOUNTER — Other Ambulatory Visit: Payer: Self-pay

## 2018-05-25 ENCOUNTER — Ambulatory Visit (AMBULATORY_SURGERY_CENTER): Payer: Self-pay | Admitting: *Deleted

## 2018-05-25 ENCOUNTER — Encounter: Payer: Self-pay | Admitting: Internal Medicine

## 2018-05-25 VITALS — Temp 98.2°F | Ht 69.0 in | Wt 179.0 lb

## 2018-05-25 DIAGNOSIS — Z1211 Encounter for screening for malignant neoplasm of colon: Secondary | ICD-10-CM

## 2018-05-25 NOTE — Progress Notes (Signed)
No egg or soy allergy known to patient  No issues with past sedation with any surgeries  or procedures, no intubation problems  No diet pills per patient No home 02 use per patient  No blood thinners per patient  Pt denies issues with constipation  No A fib or A flutter  EMMI video sent to pt's e mail - declined   

## 2018-06-01 ENCOUNTER — Telehealth: Payer: Self-pay | Admitting: *Deleted

## 2018-06-01 NOTE — Telephone Encounter (Signed)
Called and notified pt of cancellation of procedure. We will call and notify when schedules are opened.

## 2018-06-09 ENCOUNTER — Encounter: Payer: Medicare Other | Admitting: Internal Medicine

## 2018-07-04 ENCOUNTER — Encounter: Payer: Self-pay | Admitting: Internal Medicine

## 2018-07-04 ENCOUNTER — Other Ambulatory Visit (INDEPENDENT_AMBULATORY_CARE_PROVIDER_SITE_OTHER): Payer: Medicare Other

## 2018-07-04 ENCOUNTER — Other Ambulatory Visit: Payer: Self-pay

## 2018-07-04 ENCOUNTER — Ambulatory Visit (INDEPENDENT_AMBULATORY_CARE_PROVIDER_SITE_OTHER): Payer: Medicare Other | Admitting: Internal Medicine

## 2018-07-04 VITALS — BP 136/78 | HR 92 | Temp 98.0°F | Resp 16 | Ht 69.0 in | Wt 182.0 lb

## 2018-07-04 DIAGNOSIS — I10 Essential (primary) hypertension: Secondary | ICD-10-CM

## 2018-07-04 DIAGNOSIS — R739 Hyperglycemia, unspecified: Secondary | ICD-10-CM

## 2018-07-04 DIAGNOSIS — E782 Mixed hyperlipidemia: Secondary | ICD-10-CM

## 2018-07-04 DIAGNOSIS — J301 Allergic rhinitis due to pollen: Secondary | ICD-10-CM | POA: Diagnosis not present

## 2018-07-04 DIAGNOSIS — H6983 Other specified disorders of Eustachian tube, bilateral: Secondary | ICD-10-CM | POA: Insufficient documentation

## 2018-07-04 DIAGNOSIS — H6993 Unspecified Eustachian tube disorder, bilateral: Secondary | ICD-10-CM | POA: Insufficient documentation

## 2018-07-04 LAB — BASIC METABOLIC PANEL
BUN: 18 mg/dL (ref 6–23)
CO2: 25 mEq/L (ref 19–32)
Calcium: 9.5 mg/dL (ref 8.4–10.5)
Chloride: 102 mEq/L (ref 96–112)
Creatinine, Ser: 1.1 mg/dL (ref 0.40–1.50)
GFR: 64.77 mL/min (ref >60.00)
Glucose, Bld: 97 mg/dL (ref 70–99)
Potassium: 4.1 mEq/L (ref 3.5–5.1)
Sodium: 137 mEq/L (ref 135–145)

## 2018-07-04 LAB — LIPID PANEL
Cholesterol: 186 mg/dL (ref 0–200)
HDL: 52.9 mg/dL (ref >39.00)
LDL Cholesterol: 117 mg/dL — ABNORMAL HIGH (ref 0–99)
NonHDL: 132.83
Total CHOL/HDL Ratio: 4
Triglycerides: 80 mg/dL (ref 0.0–149.0)
VLDL: 16 mg/dL (ref 0.0–40.0)

## 2018-07-04 MED ORDER — AZILSARTAN MEDOXOMIL 80 MG PO TABS: 1.0000 | ORAL_TABLET | Freq: Every day | ORAL | 1 refills | Status: DC

## 2018-07-04 MED ORDER — LEVOCETIRIZINE DIHYDROCHLORIDE 5 MG PO TABS: 5.0000 mg | ORAL_TABLET | Freq: Every evening | ORAL | 1 refills | Status: DC

## 2018-07-04 MED ORDER — FLUTICASONE PROPIONATE 50 MCG/ACT NA SUSP: 2.0000 | Freq: Every day | NASAL | 1 refills | Status: AC

## 2018-07-04 NOTE — Progress Notes (Signed)
Subjective:  Patient ID: Jeffrey Watkins, male    DOB: May 18, 1940  Age: 78 y.o. MRN: [id number]  CC: Hypertension; Hyperlipidemia; and Allergic Rhinitis    HPI RIDHAAN DREIBELBIS presents for f/up - He complains of a several week history of nasal congestion, runny nose, and popping/pressure in both ears.  He is not treating this.  He tells me his blood pressure has been well controlled.  He denies any recent episodes of CP, DOE, lightheadedness, dizziness, edema, or fatigue.  He has been able to lose weight with lifestyle modifications.  Outpatient Medications Prior to Visit  Medication Sig Dispense Refill   aspirin EC 81 MG tablet Take 81 mg by mouth daily.     b complex vitamins tablet Take 1 tablet by mouth daily.     bisacodyl (DULCOLAX) 5 MG EC tablet Take 5 mg by mouth once. For colon prep 4-3     Cholecalciferol (VITAMIN D) 2000 UNITS CAPS Take 2,000 Units by mouth daily.      fluorouracil (EFUDEX) 5 % cream APPLY DAILY TO FOREHEAD AND SCALP AS DIRECTED  0   KRILL OIL PO Take by mouth.     Melatonin 3 MG TABS Take 3 mg by mouth at bedtime. 3 mg     niacin 500 MG tablet Take 500 mg by mouth 3 (three) times daily.     OVER THE COUNTER MEDICATION daily. OTC Amyloid Armour three caps daily     polyethylene glycol powder (MIRALAX) powder Take 1 Container by mouth once. 238 grams for colon prep for 4-3     Potassium 99 MG TABS Take by mouth daily.     Probiotic Product (PROBIOTIC PO) Take by mouth. Daily     Resveratrol 100 MG CAPS Take 2 capsules by mouth daily.     vitamin C (ASCORBIC ACID) 500 MG tablet Take by mouth.     Azilsartan Medoxomil (EDARBI) 80 MG TABS Take 1 tablet (80 mg total) by mouth daily. 90 tablet 1   Calcium-Magnesium (CAL-MAG PO) Take 1 tablet by mouth 2 (two) times daily.     Coenzyme Q10 (CO Q 10) 10 MG CAPS Take by mouth.     GRAPE SEED EXTRACT PO Take 150 mg by mouth daily.     halobetasol (ULTRAVATE) 0.05 % cream APPLY TO AFFECTED AREA ON  SKIN TWICE A DAY  2   Multiple Vitamins-Minerals (MULTIVITAMIN WITH MINERALS) tablet Take by mouth.     mupirocin nasal ointment (BACTROBAN NASAL) 2 % Place 1 application into the nose 2 (two) times daily. Use one-half of tube in each nostril twice daily for five (5) days. 10 g 0   Nutritional Supplements (PROSTA VITE PO) Take 4 capsules by mouth daily.     OVER THE COUNTER MEDICATION L- Arginine 350 mg 6 a day     OVER THE COUNTER MEDICATION K2 100 mcg plus D 3 5000 IU daily     OVER THE COUNTER MEDICATION Mixed Tocotrienoes 50 mg daily     OVER THE COUNTER MEDICATION Super Beta Prostate  P3 2 tablets daily     Pantethine ER 300 MG TBCR Take by mouth.     Phytosterols 450 MG TABS Take by mouth. One tablet before a meal that contains cholesterol     losartan (COZAAR) 25 MG tablet Take 2 tablets (50 mg total) by mouth daily. (Patient not taking: Reported on 07/04/2018) 60 tablet 0   losartan (COZAAR) 50 MG tablet TAKE 1 TABLET (50 MG  TOTAL) DAILY BY MOUTH. (Patient not taking: Reported on 07/04/2018) 90 tablet 1   No facility-administered medications prior to visit.     ROS Review of Systems  Constitutional: Negative for diaphoresis, fatigue and unexpected weight change.  HENT: Positive for congestion, ear pain and rhinorrhea. Negative for facial swelling, nosebleeds, postnasal drip, sinus pressure, sinus pain, sore throat and trouble swallowing.   Eyes: Negative.   Respiratory: Negative for cough, shortness of breath and wheezing.   Cardiovascular: Negative for chest pain, palpitations and leg swelling.  Gastrointestinal: Negative for abdominal pain and diarrhea.  Endocrine: Negative.   Genitourinary: Negative.  Negative for difficulty urinating.  Musculoskeletal: Negative.  Negative for arthralgias and neck pain.  Skin: Negative.  Negative for color change.  Neurological: Negative.  Negative for dizziness, weakness, light-headedness and headaches.  Hematological: Negative for  adenopathy. Does not bruise/bleed easily.  Psychiatric/Behavioral: Negative.     Objective:  BP 136/78    Pulse 92    Temp 98 F (36.7 C) (Oral)    Resp 16    Ht 5\' 9"  (1.753 m)    Wt 182 lb (82.6 kg)    SpO2 98%    BMI 26.88 kg/m   BP Readings from Last 3 Encounters:  07/04/18 136/78  01/03/18 (!) 146/90  04/27/17 128/80    Wt Readings from Last 3 Encounters:  07/04/18 182 lb (82.6 kg)  05/25/18 179 lb (81.2 kg)  01/03/18 203 lb 12 oz (92.4 kg)    Physical Exam Vitals signs reviewed.  Constitutional:      Appearance: He is not ill-appearing.  HENT:     Right Ear: Hearing, tympanic membrane, ear canal and external ear normal.     Left Ear: Hearing, tympanic membrane, ear canal and external ear normal.     Nose: Congestion and rhinorrhea present.     Mouth/Throat:     Pharynx: No oropharyngeal exudate.  Eyes:     General: No scleral icterus.    Conjunctiva/sclera: Conjunctivae normal.  Neck:     Musculoskeletal: Normal range of motion and neck supple. No neck rigidity or muscular tenderness.  Cardiovascular:     Rate and Rhythm: Normal rate and regular rhythm.     Heart sounds: No murmur.  Pulmonary:     Effort: Pulmonary effort is normal. No respiratory distress.     Breath sounds: No stridor. No wheezing, rhonchi or rales.  Abdominal:     General: Abdomen is flat.     Palpations: There is no hepatomegaly, splenomegaly or mass.     Tenderness: There is no abdominal tenderness.  Musculoskeletal: Normal range of motion.        General: No swelling.     Right lower leg: No edema.     Left lower leg: Edema present.  Lymphadenopathy:     Cervical: No cervical adenopathy.  Skin:    General: Skin is warm and dry.     Findings: No erythema or rash.  Neurological:     General: No focal deficit present.     Lab Results  Component Value Date   WBC 8.8 01/03/2018   HGB 15.5 01/03/2018   HCT 45.1 01/03/2018   PLT 202.0 01/03/2018   GLUCOSE 97 07/04/2018   CHOL  186 07/04/2018   TRIG 80.0 07/04/2018   HDL 52.90 07/04/2018   LDLDIRECT 138.8 04/17/2012   LDLCALC 117 (H) 07/04/2018   ALT 27 01/03/2018   AST 21 01/03/2018   NA 137 07/04/2018   K 4.1  07/04/2018   CL 102 07/04/2018   CREATININE 1.10 07/04/2018   BUN 18 07/04/2018   CO2 25 07/04/2018   TSH 1.83 01/03/2018   PSA 1.30 01/03/2018   HGBA1C 5.8 01/19/2017   MICROALBUR 0.7 02/22/2006    Mr Abdomen W Wo Contrast  Result Date: 02/10/2017 CLINICAL DATA:  Followup indeterminate left renal cystic lesion. EXAM: MRI ABDOMEN WITHOUT AND WITH CONTRAST TECHNIQUE: Multiplanar multisequence MR imaging of the abdomen was performed both before and after the administration of intravenous contrast. CONTRAST:  20mL MULTIHANCE GADOBENATE DIMEGLUMINE 529 MG/ML IV SOLN COMPARISON:  02/03/2016 and 07/10/2015 FINDINGS: Lower chest: No acute findings. Hepatobiliary: Stable 1.6 cm benign hemangioma in the right hepatic lobe. No other liver masses identified. Gallbladder is unremarkable. No evidence of biliary duct dilatation. Pancreas: No mass or inflammatory changes. Pancreas divisum again noted. No evidence of pancreatic ductal dilatation. Stable tiny scattered less than 5 mm cystic foci within the pancreas, consistent with benign etiology. Spleen:  Within normal limits in size and appearance. Adrenals/Urinary Tract: Stable benign simple right renal cysts. A 1.7 cm subcapsular lesion in the anterior lower pole of the left kidney remains stable compared to previous studies. This lesion shows T1 and T2 hyperintensity and no evidence of contrast enhancement on subtraction imaging, consistent with benign Bosniak category 2 hemorrhagic cyst. A few tiny sub-cm left renal cysts also noted. No evidence of hydronephrosis. Stomach/Bowel: Visualized portions within abdomen are unremarkable. Vascular/Lymphatic: No pathologically enlarged lymph nodes identified. No abdominal aortic aneurysm. Other:  None. Musculoskeletal:  No  suspicious bone lesions identified. 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 Other simple renal cysts are also stable. No evidence of renal neoplasm. Stable small benign hepatic hemangioma. Electronically Signed   By: Earle Gell M.D.   On: 02/10/2017 10:22    Assessment & Plan:   Delson was seen today for hypertension, hyperlipidemia and allergic rhinitis .  Diagnoses and all orders for this visit:  Essential hypertension- His blood pressure is adequately well controlled.  Electrolytes and renal function are normal.  Will continue the ARB at the current dose. -     Basic metabolic panel; Future -     Azilsartan Medoxomil (EDARBI) 80 MG TABS; Take 1 tablet (80 mg total) by mouth daily.  Hyperglycemia- His blood sugar was normal today. -     Basic metabolic panel; Future  Mixed hyperlipidemia- His triglycerides are normal now.  He was praised for his lifestyle modifications.  He has an elevated ASCVD risk score but is not willing to take a statin for CV risk reduction. -     Lipid panel; Future  Seasonal allergic rhinitis due to pollen -     levocetirizine (XYZAL) 5 MG tablet; Take 1 tablet (5 mg total) by mouth every evening. -     fluticasone (FLONASE) 50 MCG/ACT nasal spray; Place 2 sprays into both nostrils daily.  ETD (Eustachian tube dysfunction), bilateral -     levocetirizine (XYZAL) 5 MG tablet; Take 1 tablet (5 mg total) by mouth every evening. -     fluticasone (FLONASE) 50 MCG/ACT nasal spray; Place 2 sprays into both nostrils daily.   I have discontinued Clemens Lachman. Vallery's Calcium-Magnesium (CAL-MAG PO), halobetasol, multivitamin with minerals, mupirocin nasal ointment, losartan, losartan, Co Q 10, Pantethine ER, Nutritional Supplements (PROSTA VITE PO), GRAPE SEED EXTRACT PO, and Phytosterols. I am also having him start on levocetirizine and fluticasone. Additionally, I am having him maintain his Vitamin D, Melatonin, aspirin  EC, niacin,  fluorouracil, KRILL OIL PO, vitamin C, b complex vitamins, OVER THE COUNTER MEDICATION, Resveratrol, Potassium, Probiotic Product (PROBIOTIC PO), polyethylene glycol powder, bisacodyl, and Azilsartan Medoxomil.  Meds ordered this encounter  Medications   levocetirizine (XYZAL) 5 MG tablet    Sig: Take 1 tablet (5 mg total) by mouth every evening.    Dispense:  90 tablet    Refill:  1   fluticasone (FLONASE) 50 MCG/ACT nasal spray    Sig: Place 2 sprays into both nostrils daily.    Dispense:  48 g    Refill:  1   Azilsartan Medoxomil (EDARBI) 80 MG TABS    Sig: Take 1 tablet (80 mg total) by mouth daily.    Dispense:  90 tablet    Refill:  1     Follow-up: Return in about 6 months (around 01/03/2019).  Scarlette Calico, MD

## 2018-07-04 NOTE — Patient Instructions (Signed)

## 2018-07-11 ENCOUNTER — Telehealth: Payer: Self-pay | Admitting: *Deleted

## 2018-07-11 NOTE — Telephone Encounter (Signed)
Pt scheduled for colonoscopy. Has previsit instructions and verbalized understanding. SM

## 2018-07-24 ENCOUNTER — Telehealth: Payer: Self-pay | Admitting: *Deleted

## 2018-07-24 NOTE — Telephone Encounter (Signed)
Covid-19 travel screening questions  Have you traveled in the last 14 days?no If yes where?  Do you now or have you had a fever in the last 14 days?no  Do you have any respiratory symptoms of shortness of breath or cough now or in the last 14 days?no  Do you have a medical history of Congestive Heart Failure?  Do you have a medical history of lung disease?  Do you have any family members or close contacts with diagnosed or suspected Covid-19?no  Pt aware of care partner policy and will bring a mask with him if he has one available. SM

## 2018-07-26 ENCOUNTER — Other Ambulatory Visit: Payer: Self-pay

## 2018-07-26 ENCOUNTER — Ambulatory Visit (AMBULATORY_SURGERY_CENTER): Payer: Medicare Other | Admitting: Internal Medicine

## 2018-07-26 ENCOUNTER — Encounter: Payer: Self-pay | Admitting: Internal Medicine

## 2018-07-26 VITALS — BP 110/65 | HR 77 | Temp 98.7°F | Resp 15 | Ht 69.0 in | Wt 179.0 lb

## 2018-07-26 DIAGNOSIS — Z1211 Encounter for screening for malignant neoplasm of colon: Secondary | ICD-10-CM | POA: Diagnosis not present

## 2018-07-26 LAB — HM COLONOSCOPY

## 2018-07-26 MED ORDER — SODIUM CHLORIDE 0.9 % IV SOLN: 500.0000 mL | Freq: Once | INTRAVENOUS | Status: DC

## 2018-07-26 NOTE — Progress Notes (Signed)
Pt's states no medical or surgical changes since previsit or office visit. Courtney Washington-Temps,Judy Branson-vital signs. 

## 2018-07-26 NOTE — Op Note (Signed)
[facility] Patient Name: Jeffrey Watkins Procedure Date: 07/26/2018 11:01 AM MRN: [id number] Endoscopist: Gatha Mayer , MD Age: 78 Referring MD:  Date of Birth: 06-17-1940 Gender: Male Account #: [id number] Procedure:                Colonoscopy Indications:              Screening for colorectal malignant neoplasm Medicines:                Propofol per Anesthesia, Monitored Anesthesia Care Procedure:                Pre-Anesthesia Assessment:                           - Prior to the procedure, a History and Physical                            was performed, and patient medications and                            allergies were reviewed. The patient's tolerance of                            previous anesthesia was also reviewed. The risks                            and benefits of the procedure and the sedation                            options and risks were discussed with the patient.                            All questions were answered, and informed consent                            was obtained. Prior Anticoagulants: The patient has                            taken no previous anticoagulant or antiplatelet                            agents. ASA Grade Assessment: II - A patient with                            mild systemic disease. After reviewing the risks                            and benefits, the patient was deemed in                            satisfactory condition to undergo the procedure.                           After obtaining informed consent, the colonoscope  was passed under direct vision. Throughout the                            procedure, the patient's blood pressure, pulse, and                            oxygen saturations were monitored continuously. The                            Colonoscope was introduced through the anus and                            advanced to the the cecum, identified by   appendiceal orifice and ileocecal valve. The                            colonoscopy was performed without difficulty. The                            patient tolerated the procedure well. The quality                            of the bowel preparation was excellent. The bowel                            preparation used was Miralax via split dose                            instruction. Scope In: 11:15:03 AM Scope Out: 11:31:54 AM Scope Withdrawal Time: 0 hours 12 minutes 53 seconds  Total Procedure Duration: 0 hours 16 minutes 51 seconds  Findings:                 The perianal and digital rectal examinations were                            normal.                           Multiple diverticula were found in the sigmoid                            colon.                           The exam was otherwise without abnormality on                            direct and retroflexion views. Complications:            No immediate complications. Estimated Blood Loss:     Estimated blood loss: none. Impression:               - Diverticulosis in the sigmoid colon.                           - The examination was otherwise normal on direct  and retroflexion views.                           - No specimens collected. Recommendation:           - Patient has a contact number available for                            emergencies. The signs and symptoms of potential                            delayed complications were discussed with the                            patient. Return to normal activities tomorrow.                            Written discharge instructions were provided to the                            patient.                           - Resume previous diet.                           - Continue present medications.                           - No repeat colonoscopy due to age and the absence                            of colonic polyps. Gatha Mayer, MD 07/26/2018  11:45:25 AM This report has been signed electronically.

## 2018-07-26 NOTE — Patient Instructions (Addendum)
   No polyps or cancer seen. You do not need another routine colon cancer screening test.  Say hi to the family and stay well!  I appreciate the opportunity to care for you. Gatha Mayer, MD, FACG YOU HAD AN ENDOSCOPIC PROCEDURE TODAY AT [facility]:   Refer to the procedure report that was given to you for any specific questions about what was found during the examination.  If the procedure report does not answer your questions, please call your gastroenterologist to clarify.  If you requested that your care partner not be given the details of your procedure findings, then the procedure report has been included in a sealed envelope for you to review at your convenience later.  YOU SHOULD EXPECT: Some feelings of bloating in the abdomen. Passage of more gas than usual.  Walking can help get rid of the air that was put into your GI tract during the procedure and reduce the bloating. If you had a lower endoscopy (such as a colonoscopy or flexible sigmoidoscopy) you may notice spotting of blood in your stool or on the toilet paper. If you underwent a bowel prep for your procedure, you may not have a normal bowel movement for a few days.  Please Note:  You might notice some irritation and congestion in your nose or some drainage.  This is from the oxygen used during your procedure.  There is no need for concern and it should clear up in a day or so.  SYMPTOMS TO REPORT IMMEDIATELY:   Following lower endoscopy (colonoscopy or flexible sigmoidoscopy):  Excessive amounts of blood in the stool  Significant tenderness or worsening of abdominal pains  Swelling of the abdomen that is new, acute  Fever of 100F or higher  For urgent or emergent issues, a gastroenterologist can be reached at any hour by calling [phone].   DIET:  We do recommend a small meal at first, but then you may proceed to your regular diet.  Drink plenty of fluids but you should avoid alcoholic  beverages for 24 hours.  ACTIVITY:  You should plan to take it easy for the rest of today and you should NOT DRIVE or use heavy machinery until tomorrow (because of the sedation medicines used during the test).    FOLLOW UP: Our staff will call the number listed on your records 48-72 hours following your procedure to check on you and address any questions or concerns that you may have regarding the information given to you following your procedure. If we do not reach you, we will leave a message.  We will attempt to reach you two times.  During this call, we will ask if you have developed any symptoms of COVID 19. If you develop any symptoms (for example fever, flu-like symptoms, shortness of breath, cough etc.) before then, please call [phone].  If any biopsies were taken you will be contacted by phone or by letter within the next 1-3 weeks.  Please call us at [phone] if you have not heard about the biopsies in 3 weeks.   Diverticulosis (handout given)  SIGNATURES/CONFIDENTIALITY: You and/or your care partner have signed paperwork which will be entered into your electronic medical record.  These signatures attest to the fact that that the information above on your After Visit Summary has been reviewed and is understood.  Full responsibility of the confidentiality of this discharge information lies with you and/or your care-partner.

## 2018-07-26 NOTE — Progress Notes (Signed)
To PACU, VSS. Report to [email or web address] 

## 2018-08-01 ENCOUNTER — Telehealth: Payer: Self-pay

## 2018-08-01 NOTE — Telephone Encounter (Signed)
  Follow up Call-  Call back number 07/26/2018  Post procedure Call Back phone  # [phone]  Permission to leave phone message Yes  Some recent data might be hidden     Patient questions:  Do you have a fever, pain , or abdominal swelling? No. Pain Score  0 *  Have you tolerated food without any problems? Yes.    Have you been able to return to your normal activities? Yes.    Do you have any questions about your discharge instructions: Diet   No. Medications  No. Follow up visit  No.  Do you have questions or concerns about your Care? No.  Actions: * If pain score is 4 or above: No action needed, pain <4. 1. Have you developed a fever since your procedure? no  2.   Have you had an respiratory symptoms (SOB or cough) since your procedure? no  3.   Have you tested positive for COVID 19 since your procedure no  4.   Have you had any family members/close contacts diagnosed with the COVID 19 since your procedure?  no   If any of these questions are a yes, please inquire if patient has been seen by family doctor and route this note to Joylene John, Therapist, sports.

## 2019-01-02 ENCOUNTER — Other Ambulatory Visit (INDEPENDENT_AMBULATORY_CARE_PROVIDER_SITE_OTHER): Payer: Medicare Other

## 2019-01-02 ENCOUNTER — Encounter: Payer: Self-pay | Admitting: Internal Medicine

## 2019-01-02 ENCOUNTER — Other Ambulatory Visit: Payer: Self-pay

## 2019-01-02 ENCOUNTER — Ambulatory Visit (INDEPENDENT_AMBULATORY_CARE_PROVIDER_SITE_OTHER): Payer: Medicare Other | Admitting: Internal Medicine

## 2019-01-02 VITALS — BP 142/74 | HR 88 | Temp 98.4°F | Ht 69.0 in | Wt 185.0 lb

## 2019-01-02 DIAGNOSIS — I10 Essential (primary) hypertension: Secondary | ICD-10-CM

## 2019-01-02 DIAGNOSIS — Z23 Encounter for immunization: Secondary | ICD-10-CM

## 2019-01-02 DIAGNOSIS — H6122 Impacted cerumen, left ear: Secondary | ICD-10-CM | POA: Insufficient documentation

## 2019-01-02 DIAGNOSIS — H6123 Impacted cerumen, bilateral: Secondary | ICD-10-CM | POA: Insufficient documentation

## 2019-01-02 LAB — BASIC METABOLIC PANEL
BUN: 15 mg/dL (ref 6–23)
CO2: 32 mEq/L (ref 19–32)
Calcium: 9.9 mg/dL (ref 8.4–10.5)
Chloride: 101 mEq/L (ref 96–112)
Creatinine, Ser: 1.15 mg/dL (ref 0.40–1.50)
GFR: 61.45 mL/min (ref >60.00)
Glucose, Bld: 102 mg/dL — ABNORMAL HIGH (ref 70–99)
Potassium: 4.7 mEq/L (ref 3.5–5.1)
Sodium: 139 mEq/L (ref 135–145)

## 2019-01-02 MED ORDER — EDARBI 80 MG PO TABS: 1.0000 | ORAL_TABLET | Freq: Every day | ORAL | 1 refills | Status: DC

## 2019-01-02 NOTE — Progress Notes (Signed)
Patient consent obtained. Irrigation with water and peroxide performed. Full view of left tympanic membranes after procedure.  Patient tolerated procedure well.  

## 2019-01-02 NOTE — Progress Notes (Signed)
Subjective:  Patient ID: Jeffrey Watkins, male    DOB: 06/28/1940  Age: 78 y.o. MRN: [id number]  CC: Hypertension   HPI Jeffrey Watkins presents for f/up - He complains of a several week history of decreased level of hearing in his left ear.  He tells me his blood pressure has been well controlled.  Outpatient Medications Prior to Visit  Medication Sig Dispense Refill  . aspirin EC 81 MG tablet Take 81 mg by mouth daily.    Marland Kitchen b complex vitamins tablet Take 1 tablet by mouth daily.    . fluorouracil (EFUDEX) 5 % cream APPLY DAILY TO FOREHEAD AND SCALP AS DIRECTED  0  . fluticasone (FLONASE) 50 MCG/ACT nasal spray Place 2 sprays into both nostrils daily. 48 g 1  . levocetirizine (XYZAL) 5 MG tablet Take 1 tablet (5 mg total) by mouth every evening. 90 tablet 1  . niacin 500 MG tablet Take 500 mg by mouth 3 (three) times daily.    Marland Kitchen OVER THE COUNTER MEDICATION daily. OTC Amyloid Armour three caps daily    . Potassium 99 MG TABS Take by mouth daily.    . Probiotic Product (PROBIOTIC PO) Take by mouth. Daily    . Resveratrol 100 MG CAPS Take 2 capsules by mouth daily.    . vitamin C (ASCORBIC ACID) 500 MG tablet Take by mouth.    . Vitamin D-Vitamin K (VITAMIN K2-VITAMIN D3) 45-2000 MCG-UNIT CAPS Take by mouth.    . Azilsartan Medoxomil (EDARBI) 80 MG TABS Take 1 tablet (80 mg total) by mouth daily. 90 tablet 1  . KRILL OIL PO Take by mouth.    . Melatonin 3 MG TABS Take 3 mg by mouth at bedtime. 3 mg    . Cholecalciferol (VITAMIN D) 2000 UNITS CAPS Take 2,000 Units by mouth daily.     Marland Kitchen 0.9 %  sodium chloride infusion      No facility-administered medications prior to visit.     ROS Review of Systems  Constitutional: Negative for diaphoresis, fatigue and unexpected weight change.  HENT: Positive for hearing loss. Negative for ear discharge, ear pain and facial swelling.   Eyes: Negative.   Respiratory: Negative for cough, chest tightness, shortness of breath and wheezing.    Cardiovascular: Negative for chest pain, palpitations and leg swelling.  Gastrointestinal: Negative.  Negative for abdominal pain, diarrhea, nausea and vomiting.  Endocrine: Negative.   Genitourinary: Negative.  Negative for difficulty urinating.  Musculoskeletal: Negative.  Negative for arthralgias and myalgias.  Skin: Negative.  Negative for color change.  Neurological: Negative for dizziness, weakness and headaches.  Hematological: Negative for adenopathy. Does not bruise/bleed easily.  Psychiatric/Behavioral: Negative.     Objective:  BP (!) 142/74 (BP Location: Left Arm, Patient Position: Sitting, Cuff Size: Normal)   Pulse 88   Temp 98.4 F (36.9 C) (Oral)   Ht 5\' 9"  (1.753 m)   Wt 185 lb (83.9 kg)   SpO2 96%   BMI 27.32 kg/m   BP Readings from Last 3 Encounters:  01/02/19 (!) 142/74  07/26/18 110/65  07/04/18 136/78    Wt Readings from Last 3 Encounters:  01/02/19 185 lb (83.9 kg)  07/26/18 179 lb (81.2 kg)  07/04/18 182 lb (82.6 kg)    Physical Exam Vitals signs reviewed.  HENT:     Right Ear: Hearing, tympanic membrane, ear canal and external ear normal. There is no impacted cerumen.     Left Ear: Tympanic membrane, ear canal  and external ear normal. Decreased hearing noted. There is impacted cerumen.     Ears:     Comments: I put Colace in his left EAC and then I irrigated it using an ear pick.  The cerumen was removed.  His hearing has returned to normal.  The examination afterwards shows a normal left EAC.    Nose: Nose normal.     Mouth/Throat:     Mouth: Mucous membranes are moist.  Eyes:     General: No scleral icterus.    Conjunctiva/sclera: Conjunctivae normal.  Neck:     Musculoskeletal: Normal range of motion. No neck rigidity.  Cardiovascular:     Rate and Rhythm: Normal rate and regular rhythm.     Heart sounds: No murmur.  Pulmonary:     Effort: Pulmonary effort is normal.     Breath sounds: No stridor. No wheezing, rhonchi or rales.   Abdominal:     General: Abdomen is flat.     Palpations: There is no mass.     Tenderness: There is no abdominal tenderness. There is no guarding.  Musculoskeletal: Normal range of motion.     Right lower leg: No edema.     Left lower leg: No edema.  Skin:    General: Skin is warm and dry.  Neurological:     General: No focal deficit present.  Psychiatric:        Mood and Affect: Mood normal.        Behavior: Behavior normal.     Lab Results  Component Value Date   WBC 8.8 01/03/2018   HGB 15.5 01/03/2018   HCT 45.1 01/03/2018   PLT 202.0 01/03/2018   GLUCOSE 102 (H) 01/02/2019   CHOL 186 07/04/2018   TRIG 80.0 07/04/2018   HDL 52.90 07/04/2018   LDLDIRECT 138.8 04/17/2012   LDLCALC 117 (H) 07/04/2018   ALT 27 01/03/2018   AST 21 01/03/2018   NA 139 01/02/2019   K 4.7 01/02/2019   CL 101 01/02/2019   CREATININE 1.15 01/02/2019   BUN 15 01/02/2019   CO2 32 01/02/2019   TSH 1.83 01/03/2018   PSA 1.30 01/03/2018   HGBA1C 5.8 01/19/2017   MICROALBUR 0.7 02/22/2006    Mr Abdomen W Wo Contrast  Result Date: 02/10/2017 CLINICAL DATA:  Followup indeterminate left renal cystic lesion. EXAM: MRI ABDOMEN WITHOUT AND WITH CONTRAST TECHNIQUE: Multiplanar multisequence MR imaging of the abdomen was performed both before and after the administration of intravenous contrast. CONTRAST:  33mL MULTIHANCE GADOBENATE DIMEGLUMINE 529 MG/ML IV SOLN COMPARISON:  02/03/2016 and 07/10/2015 FINDINGS: Lower chest: No acute findings. Hepatobiliary: Stable 1.6 cm benign hemangioma in the right hepatic lobe. No other liver masses identified. Gallbladder is unremarkable. No evidence of biliary duct dilatation. Pancreas: No mass or inflammatory changes. Pancreas divisum again noted. No evidence of pancreatic ductal dilatation. Stable tiny scattered less than 5 mm cystic foci within the pancreas, consistent with benign etiology. Spleen:  Within normal limits in size and appearance. Adrenals/Urinary  Tract: Stable benign simple right renal cysts. A 1.7 cm subcapsular lesion in the anterior lower pole of the left kidney remains stable compared to previous studies. This lesion shows T1 and T2 hyperintensity and no evidence of contrast enhancement on subtraction imaging, consistent with benign Bosniak category 2 hemorrhagic cyst. A few tiny sub-cm left renal cysts also noted. No evidence of hydronephrosis. Stomach/Bowel: Visualized portions within abdomen are unremarkable. Vascular/Lymphatic: No pathologically enlarged lymph nodes identified. No abdominal aortic aneurysm. Other:  None. Musculoskeletal:  No suspicious bone lesions identified. 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 Other simple renal cysts are also stable. No evidence of renal neoplasm. Stable small benign hepatic hemangioma. Electronically Signed   By: Earle Gell M.D.   On: 02/10/2017 10:22    Assessment & Plan:   Tadeusz was seen today for hypertension.  Diagnoses and all orders for this visit:  Need for influenza vaccination -     Flu Vaccine QUAD High Dose(Fluad)  Need for pneumococcal vaccination -     Pneumococcal polysaccharide vaccine 23-valent greater than or equal to 2yo subcutaneous/IM  Hearing loss due to cerumen impaction, left- His hearing has improved after the cerumen was removed.  Essential hypertension- His blood pressure is well controlled.  Electrolytes and renal function are normal.  Will continue neither current dose of as losartan. -     Basic metabolic panel; Future -     Azilsartan Medoxomil (EDARBI) 80 MG TABS; Take 1 tablet (80 mg total) by mouth daily.   I have discontinued Dontavian Auslander. Victorino's Vitamin D, Melatonin, and KRILL OIL PO. I am also having him maintain his aspirin EC, niacin, fluorouracil, vitamin C, b complex vitamins, OVER THE COUNTER MEDICATION, Resveratrol, Potassium, Probiotic Product (PROBIOTIC PO), levocetirizine, fluticasone, Vitamin K2-Vitamin D3,  and Edarbi. We will stop administering sodium chloride.  Meds ordered this encounter  Medications  . Azilsartan Medoxomil (EDARBI) 80 MG TABS    Sig: Take 1 tablet (80 mg total) by mouth daily.    Dispense:  90 tablet    Refill:  1     Follow-up: Return in about 6 months (around 07/03/2019).  Scarlette Calico, MD

## 2019-01-02 NOTE — Patient Instructions (Signed)

## 2019-05-18 ENCOUNTER — Ambulatory Visit: Payer: Medicare PPO | Attending: Internal Medicine

## 2019-05-18 DIAGNOSIS — Z23 Encounter for immunization: Secondary | ICD-10-CM

## 2019-05-18 NOTE — Progress Notes (Signed)
   Covid-19 Vaccination Clinic  Name:  DEMARION OVERBAUGH    MRN: [id number] DOB: May 14, 1940  05/18/2019  Mr. Markowitz was observed post Covid-19 immunization for 15 minutes without incident. He was provided with Vaccine Information Sheet and instruction to access the V-Safe system.   Mr. Latimer was instructed to call 911 with any severe reactions post vaccine: Marland Kitchen Difficulty breathing  . Swelling of face and throat  . A fast heartbeat  . A bad rash all over body  . Dizziness and weakness   Immunizations Administered    Name Date Dose VIS Date Route   Pfizer COVID-19 Vaccine 05/18/2019 10:51 AM 0.3 mL 02/16/2019 Intramuscular   Manufacturer: [place]   Lot: [id number]   [place]: [id number]

## 2019-06-12 ENCOUNTER — Ambulatory Visit: Payer: Medicare PPO | Attending: Internal Medicine

## 2019-06-12 DIAGNOSIS — Z23 Encounter for immunization: Secondary | ICD-10-CM

## 2019-06-12 NOTE — Progress Notes (Signed)
   Covid-19 Vaccination Clinic  Name:  ARUL BARTHOL    MRN: [id number] DOB: 05-Aug-1940  06/12/2019  Mr. Demers was observed post Covid-19 immunization for 15 minutes without incident. He was provided with Vaccine Information Sheet and instruction to access the V-Safe system.   Mr. Bufalini was instructed to call 911 with any severe reactions post vaccine: Marland Kitchen Difficulty breathing  . Swelling of face and throat  . A fast heartbeat  . A bad rash all over body  . Dizziness and weakness   Immunizations Administered    Name Date Dose VIS Date Route   Pfizer COVID-19 Vaccine 06/12/2019 10:58 AM 0.3 mL 02/16/2019 Intramuscular   Manufacturer: [place]   Lot: [id number]   [place]: [id number]      Covid-19 Vaccination Clinic  Name:  AADON ROUTHIER    MRN: [id number] DOB: 05-24-1940  06/12/2019  Mr. Marano was observed post Covid-19 immunization for 15 minutes without incident. He was provided with Vaccine Information Sheet and instruction to access the V-Safe system.   Mr. Baranowski was instructed to call 911 with any severe reactions post vaccine: Marland Kitchen Difficulty breathing  . Swelling of face and throat  . A fast heartbeat  . A bad rash all over body  . Dizziness and weakness   Immunizations Administered    Name Date Dose VIS Date Route   Pfizer COVID-19 Vaccine 06/12/2019 10:58 AM 0.3 mL 02/16/2019 Intramuscular   Manufacturer: [place]   Lot: [id number]   [place]: [id number]

## 2019-06-25 ENCOUNTER — Other Ambulatory Visit: Payer: Self-pay | Admitting: Internal Medicine

## 2019-06-25 ENCOUNTER — Telehealth: Payer: Self-pay | Admitting: Internal Medicine

## 2019-06-25 DIAGNOSIS — I10 Essential (primary) hypertension: Secondary | ICD-10-CM

## 2019-06-25 MED ORDER — EDARBI 80 MG PO TABS: 1.0000 | ORAL_TABLET | Freq: Every day | ORAL | 1 refills | Status: DC

## 2019-06-25 NOTE — Telephone Encounter (Signed)
    1.Medication Requested: Azilsartan Medoxomil (EDARBI) 80 MG TABS  2. Pharmacy (Name, Street, [place]): [id number] - [place], [place]  3. On Med List: yes  4. Last Visit with PCP:   5. Next visit date with PCP: 07/23/19   Agent: Please be advised that RX refills may take up to 3 business days. We ask that you follow-up with your pharmacy.

## 2019-06-25 NOTE — Telephone Encounter (Signed)
Refill request for Edarbi, pt has an appointment with you in May.

## 2019-07-23 ENCOUNTER — Encounter: Payer: Self-pay | Admitting: Internal Medicine

## 2019-07-23 ENCOUNTER — Other Ambulatory Visit: Payer: Self-pay

## 2019-07-23 ENCOUNTER — Ambulatory Visit: Payer: Medicare PPO | Admitting: Internal Medicine

## 2019-07-23 VITALS — BP 142/88 | HR 84 | Temp 98.3°F | Resp 16 | Ht 69.0 in | Wt 196.0 lb

## 2019-07-23 DIAGNOSIS — E790 Hyperuricemia without signs of inflammatory arthritis and tophaceous disease: Secondary | ICD-10-CM | POA: Diagnosis not present

## 2019-07-23 DIAGNOSIS — Z0000 Encounter for general adult medical examination without abnormal findings: Secondary | ICD-10-CM

## 2019-07-23 DIAGNOSIS — N1831 Chronic kidney disease, stage 3a: Secondary | ICD-10-CM

## 2019-07-23 DIAGNOSIS — E782 Mixed hyperlipidemia: Secondary | ICD-10-CM | POA: Diagnosis not present

## 2019-07-23 DIAGNOSIS — R011 Cardiac murmur, unspecified: Secondary | ICD-10-CM | POA: Diagnosis not present

## 2019-07-23 DIAGNOSIS — I10 Essential (primary) hypertension: Secondary | ICD-10-CM

## 2019-07-23 NOTE — Patient Instructions (Signed)
Heart Murmur A heart murmur is an extra sound that is caused by chaotic blood flow through the valves of the heart. The murmur can be heard as a "hum" or "whoosh" sound when blood flows through the heart. There are two types of heart murmurs:  Innocent (benign) murmurs. Most people with this type of heart murmur do not have a heart problem. Many children have innocent heart murmurs. Your health care provider may suggest some basic tests to find out whether your murmur is an innocent murmur. If an innocent heart murmur is found, there is no need for further tests or treatment and no need to restrict activities or stop playing sports.  Abnormal murmurs. These types of murmurs can occur in children and adults. Abnormal murmurs may be a sign of a more serious heart condition, such as a heart defect present at birth (congenital defect) or heart valve disease. What are the causes?  The heart has four areas called chambers. Valves separate the upper and lower chambers from each other (tricuspid valve and mitral valve) and separate the lower chambers of the heart from pathways that lead away from the heart (aortic valve and pulmonary valve). Normally, the valves open to let blood flow through or out of your heart, and then they shut to keep the blood from flowing backward. This condition is caused by heart valves that are not working properly.  In children, abnormal heart murmurs are typically caused by congenital defects.  In adults, abnormal murmurs are usually caused by heart valve problems from disease, infection, or aging. This condition may also be caused by:  Pregnancy.  Fever.  Overactive thyroid gland.  Anemia.  Exercise.  Rapid growth spurts (in children). What are the signs or symptoms? Innocent murmurs do not cause symptoms, and many people with abnormal murmurs may not have symptoms. If symptoms do develop, they may include:  Shortness of breath.  Blue coloring of the skin,  especially on the fingertips.  Chest pain.  Palpitations, or feeling a fluttering or skipped heartbeat.  Fainting.  Persistent cough.  Getting tired much faster than expected.  Swelling in the abdomen, feet, or ankles. How is this diagnosed? This condition may be diagnosed during a routine physical or other exam. If your health care provider hears a murmur with a stethoscope, he or she will listen for:  Where the murmur is located in your heart.  How long the murmur lasts (duration).  When the murmur is heard during the heartbeat.  How loud the murmur is. This may help the health care provider figure out what is causing the murmur. You may be referred to a heart specialist (cardiologist). You may also have other tests, including:  Electrocardiogram (ECG or EKG). This test measures the electrical activity of your heart.  Echocardiogram. This test uses high frequency sound waves to make pictures of your heart.  MRI or chest X-ray.  Cardiac catheterization. This test looks at blood flow through the arteries around the heart. For children and adults who have an abnormal heart murmur and want to stay active, it is important to:  Complete testing.  Review test results.  Receive recommendations from your health care provider. If heart disease is present, it may not be safe to play or be active. How is this treated? Heart murmurs themselves do not need treatment. In some cases, a heart murmur may go away on its own. If an underlying problem or disease is causing the murmur, you may need treatment. If treatment  is needed, it will depend on the type and severity of the disease or heart problem causing the murmur. Treatment may include:  Medicine.  Surgery.  Dietary and lifestyle changes. Follow these instructions at home:  Talk with your health care provider before participating in sports or other activities that require a lot of effort and energy (are strenuous).  Learn as  much as possible about your condition and any related diseases. Ask your health care provider if you may be at risk for any medical emergencies.  Talk with your health care provider about what symptoms you should look out for.  It is up to you to get your test results. Ask your health care provider, or the department that is doing the test, when your results will be ready.  Keep all follow-up visits as told by your health care provider. This is important. Contact a health care provider if:  You are frequently short of breath.  You feel more tired than usual.  You are having a hard time keeping up with normal activities or fitness routines.  You have swelling in your ankles or feet.  You notice that your heart often beats irregularly.  You develop any new symptoms. Get help right away if:  You have chest pain.  You are having trouble breathing.  You feel light-headed or you pass out.  Your symptoms suddenly get worse. These symptoms may represent a serious problem that is an emergency. Do not wait to see if the symptoms will go away. Get medical help right away. Call your local emergency services (911 in the [place] Do not drive yourself to the hospital. Summary  Normally, the heart valves open to let blood flow through or out of your heart, and then they shut to keep the blood from flowing backward.  A heart murmur is caused by heart valves that are not working properly.  You may need treatment if an underlying problem or disease is causing the heart murmur. Treatment may include medicine, surgery, or dietary and lifestyle changes.  Talk with your health care provider before participating in sports or other activities that require a lot of effort and energy (are strenuous).  Talk with your health care provider about what symptoms you should watch out for. This information is not intended to replace advice given to you by your health care provider. Make sure you discuss any  questions you have with your health care provider. Document Revised: 08/16/2017 Document Reviewed: 08/16/2017 Elsevier Patient Education  2020 [employer]  

## 2019-07-23 NOTE — Progress Notes (Signed)
Subjective:  Patient ID: Jeffrey Watkins, male    DOB: 03-21-1940  Age: 79 y.o. MRN: [id number]  CC: Annual Exam, Hypertension, and Hyperlipidemia  This visit occurred during the SARS-CoV-2 public health emergency.  Safety protocols were in place, including screening questions prior to the visit, additional usage of staff PPE, and extensive cleaning of exam room while observing appropriate contact time as indicated for disinfecting solutions.    HPI Jeffrey Watkins presents for a CPX.  He is active and denies any recent episodes of chest pain, shortness of breath, palpitations, edema, or fatigue.  His only complaint is weight gain.  Outpatient Medications Prior to Visit  Medication Sig Dispense Refill  . aspirin EC 81 MG tablet Take 81 mg by mouth daily.    . Azilsartan Medoxomil (EDARBI) 80 MG TABS Take 1 tablet (80 mg total) by mouth daily. 90 tablet 1  . b complex vitamins tablet Take 1 tablet by mouth daily.    . Calcium-Magnesium-Vitamin D (CALCIUM MAGNESIUM PO) Take 2 tablets by mouth daily.    Marland Kitchen co-enzyme Q-10 30 MG capsule Take by mouth.    . Emollient (VITAMIN E WITH PANTHENOL EX) Apply topically.    Marland Kitchen GRAPE SEED EXTRACT PO Take by mouth.    . L-ARGININE PO Take by mouth.    . Misc Natural Products (PROSTATE HEALTH PO) Take by mouth.    . Multiple Vitamin (MULTIVITAMIN) tablet Take 1 tablet by mouth daily.    . niacin 500 MG tablet Take 500 mg by mouth 3 (three) times daily.    . Omega-3 Fatty Acids (FISH OIL ULTRA PO) Take by mouth.    Marland Kitchen OVER THE COUNTER MEDICATION daily. OTC Amyloid Armour three caps daily    . Potassium 99 MG TABS Take by mouth daily.    . Probiotic Product (PROBIOTIC PO) Take by mouth. Daily    . Resveratrol 100 MG CAPS Take 2 capsules by mouth daily.    . vitamin C (ASCORBIC ACID) 500 MG tablet Take by mouth.    . Vitamin D-Vitamin K (VITAMIN K2-VITAMIN D3) 45-2000 MCG-UNIT CAPS Take by mouth.    . fluorouracil (EFUDEX) 5 % cream APPLY DAILY TO FOREHEAD  AND SCALP AS DIRECTED  0  . fluticasone (FLONASE) 50 MCG/ACT nasal spray Place 2 sprays into both nostrils daily. (Patient not taking: Reported on 07/23/2019) 48 g 1  . levocetirizine (XYZAL) 5 MG tablet Take 1 tablet (5 mg total) by mouth every evening. (Patient not taking: Reported on 07/23/2019) 90 tablet 1   No facility-administered medications prior to visit.    ROS Review of Systems  Constitutional: Positive for unexpected weight change (wt gain). Negative for appetite change, chills, diaphoresis and fatigue.  HENT: Negative.   Eyes: Negative.   Respiratory: Negative for cough, chest tightness, shortness of breath and wheezing.   Cardiovascular: Negative for chest pain, palpitations and leg swelling.  Gastrointestinal: Negative for abdominal pain, constipation, diarrhea and vomiting.  Endocrine: Negative.   Genitourinary: Negative.  Negative for difficulty urinating.  Musculoskeletal: Negative.  Negative for arthralgias and myalgias.  Skin: Negative.  Negative for color change, pallor and rash.  Neurological: Negative.  Negative for dizziness, weakness and light-headedness.  Hematological: Negative for adenopathy. Does not bruise/bleed easily.  Psychiatric/Behavioral: Negative.     Objective:  BP (!) 142/88   Pulse 84   Temp 98.3 F (36.8 C)   Resp 16   Ht 5\' 9"  (1.753 m)   Wt 196 lb (88.9  kg)   SpO2 95%   BMI 28.94 kg/m   BP Readings from Last 3 Encounters:  07/24/19 (!) 142/88  01/02/19 (!) 142/74  07/26/18 110/65    Wt Readings from Last 3 Encounters:  07/24/19 196 lb (88.9 kg)  01/02/19 185 lb (83.9 kg)  07/26/18 179 lb (81.2 kg)    Physical Exam Vitals reviewed.  Constitutional:      Appearance: Normal appearance.  HENT:     Nose: Nose normal.     Mouth/Throat:     Mouth: Mucous membranes are moist.  Eyes:     General: No scleral icterus.    Conjunctiva/sclera: Conjunctivae normal.  Cardiovascular:     Rate and Rhythm: Normal rate and regular  rhythm.     Heart sounds: Murmur present. Systolic murmur present with a grade of 1/6. No gallop.      Comments: EKG - NSR, 76 bpm Normal EKG Pulmonary:     Effort: Pulmonary effort is normal.     Breath sounds: No stridor. No wheezing, rhonchi or rales.  Abdominal:     General: Abdomen is flat. Bowel sounds are normal. There is no distension.     Palpations: Abdomen is soft. There is no hepatomegaly, splenomegaly or mass.     Tenderness: There is no abdominal tenderness.  Musculoskeletal:     Cervical back: Neck supple.     Right lower leg: No edema.     Left lower leg: No edema.  Lymphadenopathy:     Cervical: No cervical adenopathy.  Neurological:     General: No focal deficit present.     Mental Status: He is alert and oriented to person, place, and time. Mental status is at baseline.  Psychiatric:        Mood and Affect: Mood normal.        Behavior: Behavior normal.     Lab Results  Component Value Date   WBC 7.7 07/24/2019   HGB 15.0 07/24/2019   HCT 43.1 07/24/2019   PLT 225.0 07/24/2019   GLUCOSE 110 (H) 07/24/2019   CHOL 247 (H) 07/24/2019   TRIG 91.0 07/24/2019   HDL 51.90 07/24/2019   LDLDIRECT 138.8 04/17/2012   LDLCALC 177 (H) 07/24/2019   ALT 17 07/24/2019   AST 21 07/24/2019   NA 137 07/24/2019   K 4.4 07/24/2019   CL 101 07/24/2019   CREATININE 1.18 07/24/2019   BUN 16 07/24/2019   CO2 30 07/24/2019   TSH 2.73 07/24/2019   PSA 1.30 01/03/2018   HGBA1C 5.8 01/19/2017   MICROALBUR 0.7 02/22/2006    MR Abdomen W Wo Contrast  Result Date: 02/10/2017 CLINICAL DATA:  Followup indeterminate left renal cystic lesion. EXAM: MRI ABDOMEN WITHOUT AND WITH CONTRAST TECHNIQUE: Multiplanar multisequence MR imaging of the abdomen was performed both before and after the administration of intravenous contrast. CONTRAST:  61mL MULTIHANCE GADOBENATE DIMEGLUMINE 529 MG/ML IV SOLN COMPARISON:  02/03/2016 and 07/10/2015 FINDINGS: Lower chest: No acute findings.  Hepatobiliary: Stable 1.6 cm benign hemangioma in the right hepatic lobe. No other liver masses identified. Gallbladder is unremarkable. No evidence of biliary duct dilatation. Pancreas: No mass or inflammatory changes. Pancreas divisum again noted. No evidence of pancreatic ductal dilatation. Stable tiny scattered less than 5 mm cystic foci within the pancreas, consistent with benign etiology. Spleen:  Within normal limits in size and appearance. Adrenals/Urinary Tract: Stable benign simple right renal cysts. A 1.7 cm subcapsular lesion in the anterior lower pole of the left kidney remains stable  compared to previous studies. This lesion shows T1 and T2 hyperintensity and no evidence of contrast enhancement on subtraction imaging, consistent with benign Bosniak category 2 hemorrhagic cyst. A few tiny sub-cm left renal cysts also noted. No evidence of hydronephrosis. Stomach/Bowel: Visualized portions within abdomen are unremarkable. Vascular/Lymphatic: No pathologically enlarged lymph nodes identified. No abdominal aortic aneurysm. Other:  None. Musculoskeletal:  No suspicious bone lesions identified. 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 Other simple renal cysts are also stable. No evidence of renal neoplasm. Stable small benign hepatic hemangioma. Electronically Signed   By: Earle Gell M.D.   On: 02/10/2017 10:22    Assessment & Plan:   Jeffrey Watkins was seen today for annual exam, hypertension and hyperlipidemia.  Diagnoses and all orders for this visit:  Essential hypertension- His blood pressure is adequately well controlled.  Electrolytes are normal.  Will continue the current dose of the ARB. -     CBC with Differential/Platelet; Future -     Basic metabolic panel; Future -     TSH; Future -     Urinalysis, Routine w reflex microscopic; Future  Routine general medical examination at a health care facility- Exam completed, labs reviewed, vaccines reviewed and  updated, no cancer screenings are indicated, patient education was given.  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  Treatment is not indicated. -     Uric acid; Future  Mixed hyperlipidemia- He is not willing to take a statin for CV risk reduction. -     Lipid panel; Future -     TSH; Future -     Hepatic function panel; Future  Heart murmur- He is asymptomatic with this and his EKG is normal.  I have asked him to undergo an echocardiogram to see if he has developed aortic stenosis. -     EKG 12-Lead -     ECHOCARDIOGRAM COMPLETE; Future  Stage 3a chronic kidney disease- He has developed mild renal impairment.  Will continue to maintain tight control of his blood pressure.  He agrees to avoid nephrotoxic agents.   I am having Jeffrey Watkins. Jeffrey Watkins maintain his aspirin EC, niacin, fluorouracil, vitamin C, b complex vitamins, OVER THE COUNTER MEDICATION, Resveratrol, Potassium, Probiotic Product (PROBIOTIC PO), levocetirizine, fluticasone, Vitamin K2-Vitamin D3, Edarbi, co-enzyme Q-10, multivitamin, L-ARGININE PO, Calcium-Magnesium-Vitamin D (CALCIUM MAGNESIUM PO), GRAPE SEED EXTRACT PO, Emollient (VITAMIN E WITH PANTHENOL EX), Misc Natural Products (PROSTATE HEALTH PO), and Omega-3 Fatty Acids (FISH OIL ULTRA PO).  No orders of the defined types were placed in this encounter.    Follow-up: Return in about 6 months (around 01/23/2020).  Scarlette Calico, MD

## 2019-07-24 ENCOUNTER — Other Ambulatory Visit: Payer: Medicare PPO

## 2019-07-24 DIAGNOSIS — N1831 Chronic kidney disease, stage 3a: Secondary | ICD-10-CM | POA: Insufficient documentation

## 2019-07-24 DIAGNOSIS — E782 Mixed hyperlipidemia: Secondary | ICD-10-CM

## 2019-07-24 DIAGNOSIS — R011 Cardiac murmur, unspecified: Secondary | ICD-10-CM | POA: Insufficient documentation

## 2019-07-24 DIAGNOSIS — Z85828 Personal history of other malignant neoplasm of skin: Secondary | ICD-10-CM | POA: Diagnosis not present

## 2019-07-24 DIAGNOSIS — I10 Essential (primary) hypertension: Secondary | ICD-10-CM

## 2019-07-24 DIAGNOSIS — L821 Other seborrheic keratosis: Secondary | ICD-10-CM | POA: Diagnosis not present

## 2019-07-24 DIAGNOSIS — D1801 Hemangioma of skin and subcutaneous tissue: Secondary | ICD-10-CM | POA: Diagnosis not present

## 2019-07-24 DIAGNOSIS — L570 Actinic keratosis: Secondary | ICD-10-CM | POA: Diagnosis not present

## 2019-07-24 DIAGNOSIS — E790 Hyperuricemia without signs of inflammatory arthritis and tophaceous disease: Secondary | ICD-10-CM

## 2019-07-24 DIAGNOSIS — L812 Freckles: Secondary | ICD-10-CM | POA: Diagnosis not present

## 2019-07-24 LAB — LIPID PANEL
Cholesterol: 247 mg/dL — ABNORMAL HIGH (ref 0–200)
HDL: 51.9 mg/dL (ref >39.00)
LDL Cholesterol: 177 mg/dL — ABNORMAL HIGH (ref 0–99)
NonHDL: 195.46
Total CHOL/HDL Ratio: 5
Triglycerides: 91 mg/dL (ref 0.0–149.0)
VLDL: 18.2 mg/dL (ref 0.0–40.0)

## 2019-07-24 LAB — CBC WITH DIFFERENTIAL/PLATELET
Basophils Absolute: 0 10*3/uL (ref 0.0–0.1)
Basophils Relative: 0.2 % (ref 0.0–3.0)
Eosinophils Absolute: 0.2 10*3/uL (ref 0.0–0.7)
Eosinophils Relative: 2.2 % (ref 0.0–5.0)
HCT: 43.1 % (ref 39.0–52.0)
Hemoglobin: 15 g/dL (ref 13.0–17.0)
Lymphocytes Relative: 30.3 % (ref 12.0–46.0)
Lymphs Abs: 2.3 10*3/uL (ref 0.7–4.0)
MCHC: 34.9 g/dL (ref 30.0–36.0)
MCV: 94.4 fl (ref 78.0–100.0)
Monocytes Absolute: 0.7 10*3/uL (ref 0.1–1.0)
Monocytes Relative: 9.6 % (ref 3.0–12.0)
Neutro Abs: 4.4 10*3/uL (ref 1.4–7.7)
Neutrophils Relative %: 57.7 % (ref 43.0–77.0)
Platelets: 225 10*3/uL (ref 150.0–400.0)
RBC: 4.56 Mil/uL (ref 4.22–5.81)
RDW: 13.3 % (ref 11.5–15.5)
WBC: 7.7 10*3/uL (ref 4.0–10.5)

## 2019-07-24 LAB — URINALYSIS, ROUTINE W REFLEX MICROSCOPIC
Bilirubin Urine: NEGATIVE
Ketones, ur: NEGATIVE
Leukocytes,Ua: NEGATIVE
Nitrite: NEGATIVE
Specific Gravity, Urine: 1.02 (ref 1.000–1.030)
Total Protein, Urine: NEGATIVE
Urine Glucose: NEGATIVE
Urobilinogen, UA: 0.2 (ref 0.0–1.0)
pH: 6 (ref 5.0–8.0)

## 2019-07-24 LAB — HEPATIC FUNCTION PANEL
ALT: 17 U/L (ref 0–53)
AST: 21 U/L (ref 0–37)
Albumin: 4.7 g/dL (ref 3.5–5.2)
Alkaline Phosphatase: 38 U/L — ABNORMAL LOW (ref 39–117)
Bilirubin, Direct: 0.2 mg/dL (ref 0.0–0.3)
Total Bilirubin: 0.6 mg/dL (ref 0.2–1.2)
Total Protein: 7 g/dL (ref 6.0–8.3)

## 2019-07-24 LAB — BASIC METABOLIC PANEL
BUN: 16 mg/dL (ref 6–23)
CO2: 30 mEq/L (ref 19–32)
Calcium: 9.9 mg/dL (ref 8.4–10.5)
Chloride: 101 mEq/L (ref 96–112)
Creatinine, Ser: 1.18 mg/dL (ref 0.40–1.50)
GFR: 59.57 mL/min — ABNORMAL LOW (ref >60.00)
Glucose, Bld: 110 mg/dL — ABNORMAL HIGH (ref 70–99)
Potassium: 4.4 mEq/L (ref 3.5–5.1)
Sodium: 137 mEq/L (ref 135–145)

## 2019-07-24 LAB — TSH: TSH: 2.73 u[IU]/mL (ref 0.35–4.50)

## 2019-07-24 LAB — URIC ACID: Uric Acid, Serum: 7.2 mg/dL (ref 4.0–7.8)

## 2019-07-25 ENCOUNTER — Encounter: Payer: Self-pay | Admitting: Internal Medicine

## 2019-08-08 ENCOUNTER — Other Ambulatory Visit: Payer: Self-pay

## 2019-08-08 ENCOUNTER — Ambulatory Visit (HOSPITAL_COMMUNITY): Payer: Medicare PPO | Attending: Cardiology

## 2019-08-08 DIAGNOSIS — R011 Cardiac murmur, unspecified: Secondary | ICD-10-CM | POA: Insufficient documentation

## 2019-10-11 ENCOUNTER — Other Ambulatory Visit: Payer: Self-pay | Admitting: Internal Medicine

## 2019-10-11 DIAGNOSIS — I10 Essential (primary) hypertension: Secondary | ICD-10-CM

## 2019-12-15 ENCOUNTER — Other Ambulatory Visit: Payer: Self-pay | Admitting: Internal Medicine

## 2019-12-15 DIAGNOSIS — H6983 Other specified disorders of Eustachian tube, bilateral: Secondary | ICD-10-CM

## 2019-12-15 DIAGNOSIS — J301 Allergic rhinitis due to pollen: Secondary | ICD-10-CM

## 2020-01-22 DIAGNOSIS — D1801 Hemangioma of skin and subcutaneous tissue: Secondary | ICD-10-CM | POA: Diagnosis not present

## 2020-01-22 DIAGNOSIS — L821 Other seborrheic keratosis: Secondary | ICD-10-CM | POA: Diagnosis not present

## 2020-01-22 DIAGNOSIS — L570 Actinic keratosis: Secondary | ICD-10-CM | POA: Diagnosis not present

## 2020-01-22 DIAGNOSIS — Z85828 Personal history of other malignant neoplasm of skin: Secondary | ICD-10-CM | POA: Diagnosis not present

## 2020-05-15 DIAGNOSIS — H6123 Impacted cerumen, bilateral: Secondary | ICD-10-CM | POA: Diagnosis not present

## 2020-06-10 ENCOUNTER — Other Ambulatory Visit: Payer: Self-pay | Admitting: Internal Medicine

## 2020-06-10 DIAGNOSIS — I10 Essential (primary) hypertension: Secondary | ICD-10-CM

## 2020-06-19 DIAGNOSIS — Z85828 Personal history of other malignant neoplasm of skin: Secondary | ICD-10-CM | POA: Diagnosis not present

## 2020-06-19 DIAGNOSIS — L720 Epidermal cyst: Secondary | ICD-10-CM | POA: Diagnosis not present

## 2020-07-07 DIAGNOSIS — C44329 Squamous cell carcinoma of skin of other parts of face: Secondary | ICD-10-CM | POA: Diagnosis not present

## 2020-07-07 DIAGNOSIS — Z85828 Personal history of other malignant neoplasm of skin: Secondary | ICD-10-CM | POA: Diagnosis not present

## 2020-07-22 DIAGNOSIS — L820 Inflamed seborrheic keratosis: Secondary | ICD-10-CM | POA: Diagnosis not present

## 2020-07-22 DIAGNOSIS — L821 Other seborrheic keratosis: Secondary | ICD-10-CM | POA: Diagnosis not present

## 2020-07-22 DIAGNOSIS — L812 Freckles: Secondary | ICD-10-CM | POA: Diagnosis not present

## 2020-07-22 DIAGNOSIS — D1801 Hemangioma of skin and subcutaneous tissue: Secondary | ICD-10-CM | POA: Diagnosis not present

## 2020-07-22 DIAGNOSIS — Z85828 Personal history of other malignant neoplasm of skin: Secondary | ICD-10-CM | POA: Diagnosis not present

## 2020-07-22 DIAGNOSIS — L570 Actinic keratosis: Secondary | ICD-10-CM | POA: Diagnosis not present

## 2020-07-22 DIAGNOSIS — D225 Melanocytic nevi of trunk: Secondary | ICD-10-CM | POA: Diagnosis not present

## 2020-08-15 ENCOUNTER — Other Ambulatory Visit: Payer: Self-pay | Admitting: Internal Medicine

## 2020-08-15 DIAGNOSIS — I10 Essential (primary) hypertension: Secondary | ICD-10-CM

## 2020-08-21 ENCOUNTER — Ambulatory Visit: Payer: Medicare PPO | Admitting: Internal Medicine

## 2020-08-21 ENCOUNTER — Ambulatory Visit (INDEPENDENT_AMBULATORY_CARE_PROVIDER_SITE_OTHER): Payer: Medicare PPO | Admitting: Internal Medicine

## 2020-08-21 ENCOUNTER — Other Ambulatory Visit: Payer: Self-pay

## 2020-08-21 ENCOUNTER — Encounter: Payer: Self-pay | Admitting: Internal Medicine

## 2020-08-21 VITALS — BP 148/86 | HR 81 | Temp 97.6°F | Resp 16 | Ht 69.0 in | Wt 194.0 lb

## 2020-08-21 DIAGNOSIS — R739 Hyperglycemia, unspecified: Secondary | ICD-10-CM | POA: Diagnosis not present

## 2020-08-21 DIAGNOSIS — I10 Essential (primary) hypertension: Secondary | ICD-10-CM | POA: Diagnosis not present

## 2020-08-21 DIAGNOSIS — I5189 Other ill-defined heart diseases: Secondary | ICD-10-CM

## 2020-08-21 DIAGNOSIS — E790 Hyperuricemia without signs of inflammatory arthritis and tophaceous disease: Secondary | ICD-10-CM | POA: Diagnosis not present

## 2020-08-21 DIAGNOSIS — Z0001 Encounter for general adult medical examination with abnormal findings: Secondary | ICD-10-CM | POA: Diagnosis not present

## 2020-08-21 DIAGNOSIS — E782 Mixed hyperlipidemia: Secondary | ICD-10-CM | POA: Diagnosis not present

## 2020-08-21 DIAGNOSIS — N1831 Chronic kidney disease, stage 3a: Secondary | ICD-10-CM | POA: Diagnosis not present

## 2020-08-21 LAB — BASIC METABOLIC PANEL
BUN: 16 mg/dL (ref 6–23)
CO2: 29 mEq/L (ref 19–32)
Calcium: 9.9 mg/dL (ref 8.4–10.5)
Chloride: 99 mEq/L (ref 96–112)
Creatinine, Ser: 1.22 mg/dL (ref 0.40–1.50)
GFR: 56.22 mL/min — ABNORMAL LOW (ref >60.00)
Glucose, Bld: 101 mg/dL — ABNORMAL HIGH (ref 70–99)
Potassium: 4.3 mEq/L (ref 3.5–5.1)
Sodium: 134 mEq/L — ABNORMAL LOW (ref 135–145)

## 2020-08-21 LAB — URINALYSIS, ROUTINE W REFLEX MICROSCOPIC
Bilirubin Urine: NEGATIVE
Ketones, ur: NEGATIVE
Leukocytes,Ua: NEGATIVE
Nitrite: NEGATIVE
Specific Gravity, Urine: 1.015 (ref 1.000–1.030)
Total Protein, Urine: NEGATIVE
Urine Glucose: NEGATIVE
Urobilinogen, UA: 0.2 (ref 0.0–1.0)
pH: 6 (ref 5.0–8.0)

## 2020-08-21 LAB — LIPID PANEL
Cholesterol: 233 mg/dL — ABNORMAL HIGH (ref 0–200)
HDL: 50.6 mg/dL (ref >39.00)
LDL Cholesterol: 163 mg/dL — ABNORMAL HIGH (ref 0–99)
NonHDL: 182.46
Total CHOL/HDL Ratio: 5
Triglycerides: 96 mg/dL (ref 0.0–149.0)
VLDL: 19.2 mg/dL (ref 0.0–40.0)

## 2020-08-21 LAB — HEPATIC FUNCTION PANEL
ALT: 18 U/L (ref 0–53)
AST: 21 U/L (ref 0–37)
Albumin: 4.5 g/dL (ref 3.5–5.2)
Alkaline Phosphatase: 37 U/L — ABNORMAL LOW (ref 39–117)
Bilirubin, Direct: 0.1 mg/dL (ref 0.0–0.3)
Total Bilirubin: 0.6 mg/dL (ref 0.2–1.2)
Total Protein: 7 g/dL (ref 6.0–8.3)

## 2020-08-21 LAB — CBC WITH DIFFERENTIAL/PLATELET
Basophils Absolute: 0 10*3/uL (ref 0.0–0.1)
Basophils Relative: 0.3 % (ref 0.0–3.0)
Eosinophils Absolute: 0.2 10*3/uL (ref 0.0–0.7)
Eosinophils Relative: 2.3 % (ref 0.0–5.0)
HCT: 43.1 % (ref 39.0–52.0)
Hemoglobin: 14.7 g/dL (ref 13.0–17.0)
Lymphocytes Relative: 27.1 % (ref 12.0–46.0)
Lymphs Abs: 1.8 10*3/uL (ref 0.7–4.0)
MCHC: 34 g/dL (ref 30.0–36.0)
MCV: 93.6 fl (ref 78.0–100.0)
Monocytes Absolute: 0.8 10*3/uL (ref 0.1–1.0)
Monocytes Relative: 11.4 % (ref 3.0–12.0)
Neutro Abs: 3.9 10*3/uL (ref 1.4–7.7)
Neutrophils Relative %: 58.9 % (ref 43.0–77.0)
Platelets: 233 10*3/uL (ref 150.0–400.0)
RBC: 4.6 Mil/uL (ref 4.22–5.81)
RDW: 13.1 % (ref 11.5–15.5)
WBC: 6.6 10*3/uL (ref 4.0–10.5)

## 2020-08-21 LAB — TSH: TSH: 2.95 u[IU]/mL (ref 0.35–4.50)

## 2020-08-21 LAB — URIC ACID: Uric Acid, Serum: 7.1 mg/dL (ref 4.0–7.8)

## 2020-08-21 LAB — HEMOGLOBIN A1C: Hgb A1c MFr Bld: 5.9 % (ref 4.6–6.5)

## 2020-08-21 MED ORDER — EDARBI 80 MG PO TABS: 1.0000 | ORAL_TABLET | Freq: Every day | ORAL | 1 refills | Status: DC

## 2020-08-21 NOTE — Patient Instructions (Signed)
Health Maintenance, Male Adopting a healthy lifestyle and getting preventive care are important in promoting health and wellness. Ask your health care provider about: The right schedule for you to have regular tests and exams. Things you can do on your own to prevent diseases and keep yourself healthy. What should I know about diet, weight, and exercise? Eat a healthy diet  Eat a diet that includes plenty of vegetables, fruits, low-fat dairy products, and lean protein. Do not eat a lot of foods that are high in solid fats, added sugars, or sodium.  Maintain a healthy weight Body mass index (BMI) is a measurement that can be used to identify possible weight problems. It estimates body fat based on height and weight. Your health care provider can help determine your BMI and help you achieve or maintain ahealthy weight. Get regular exercise Get regular exercise. This is one of the most important things you can do for your health. Most adults should: Exercise for at least 150 minutes each week. The exercise should increase your heart rate and make you sweat (moderate-intensity exercise). Do strengthening exercises at least twice a week. This is in addition to the moderate-intensity exercise. Spend less time sitting. Even light physical activity can be beneficial. Watch cholesterol and blood lipids Have your blood tested for lipids and cholesterol at 80 years of age, then havethis test every 5 years. You may need to have your cholesterol levels checked more often if: Your lipid or cholesterol levels are high. You are older than 80 years of age. You are at high risk for heart disease. What should I know about cancer screening? Many types of cancers can be detected early and may often be prevented. Depending on your health history and family history, you may need to have cancer screening at various ages. This may include screening for: Colorectal cancer. Prostate cancer. Skin cancer. Lung  cancer. What should I know about heart disease, diabetes, and high blood pressure? Blood pressure and heart disease High blood pressure causes heart disease and increases the risk of stroke. This is more likely to develop in people who have high blood pressure readings, are of African descent, or are overweight. Talk with your health care provider about your target blood pressure readings. Have your blood pressure checked: Every 3-5 years if you are 18-39 years of age. Every year if you are 40 years old or older. If you are between the ages of 65 and 75 and are a current or former smoker, ask your health care provider if you should have a one-time screening for abdominal aortic aneurysm (AAA). Diabetes Have regular diabetes screenings. This checks your fasting blood sugar level. Have the screening done: Once every three years after age 45 if you are at a normal weight and have a low risk for diabetes. More often and at a younger age if you are overweight or have a high risk for diabetes. What should I know about preventing infection? Hepatitis B If you have a higher risk for hepatitis B, you should be screened for this virus. Talk with your health care provider to find out if you are at risk forhepatitis B infection. Hepatitis C Blood testing is recommended for: Everyone born from 1945 through 1965. Anyone with known risk factors for hepatitis C. Sexually transmitted infections (STIs) You should be screened each year for STIs, including gonorrhea and chlamydia, if: You are sexually active and are younger than 80 years of age. You are older than 80 years of age   and your health care provider tells you that you are at risk for this type of infection. Your sexual activity has changed since you were last screened, and you are at increased risk for chlamydia or gonorrhea. Ask your health care provider if you are at risk. Ask your health care provider about whether you are at high risk for HIV.  Your health care provider may recommend a prescription medicine to help prevent HIV infection. If you choose to take medicine to prevent HIV, you should first get tested for HIV. You should then be tested every 3 months for as long as you are taking the medicine. Follow these instructions at home: Lifestyle Do not use any products that contain nicotine or tobacco, such as cigarettes, e-cigarettes, and chewing tobacco. If you need help quitting, ask your health care provider. Do not use street drugs. Do not share needles. Ask your health care provider for help if you need support or information about quitting drugs. Alcohol use Do not drink alcohol if your health care provider tells you not to drink. If you drink alcohol: Limit how much you have to 0-2 drinks a day. Be aware of how much alcohol is in your drink. In the [place] one drink equals one 12 oz bottle of beer (355 mL), one 5 oz glass of wine (148 mL), or one 1 oz glass of hard liquor (44 mL). General instructions Schedule regular health, dental, and eye exams. Stay current with your vaccines. Tell your health care provider if: You often feel depressed. You have ever been abused or do not feel safe at home. Summary Adopting a healthy lifestyle and getting preventive care are important in promoting health and wellness. Follow your health care provider's instructions about healthy diet, exercising, and getting tested or screened for diseases. Follow your health care provider's instructions on monitoring your cholesterol and blood pressure. This information is not intended to replace advice given to you by your health care provider. Make sure you discuss any questions you have with your healthcare provider. Document Revised: 02/15/2018 Document Reviewed: 02/15/2018 [employer] Patient Education  2022 [employer]  

## 2020-08-21 NOTE — Progress Notes (Signed)
Subjective:  Patient ID: Jeffrey Watkins, male    DOB: [birth year]  Age: 80 y.o. MRN: [id number]  CC: Annual Exam and Hypertension  This visit occurred during the SARS-CoV-2 public health emergency.  Safety protocols were in place, including screening questions prior to the visit, additional usage of staff PPE, and extensive cleaning of exam room while observing appropriate contact time as indicated for disinfecting solutions.    HPI ERNAN RUNKLES presents for a CPX and f/up -   He tells me his blood pressure has been well controlled.  He is active and denies any recent episodes of headache, blurred vision, diaphoresis, chest pain, shortness of breath, dizziness, or lightheadedness.  Outpatient Medications Prior to Visit  Medication Sig Dispense Refill   aspirin EC 81 MG tablet Take 81 mg by mouth daily.     b complex vitamins tablet Take 1 tablet by mouth daily.     Calcium-Magnesium-Vitamin D (CALCIUM MAGNESIUM PO) Take 2 tablets by mouth daily.     co-enzyme Q-10 30 MG capsule Take by mouth.     Emollient (VITAMIN E WITH PANTHENOL EX) Apply topically.     fluorouracil (EFUDEX) 5 % cream APPLY DAILY TO FOREHEAD AND SCALP AS DIRECTED  0   fluticasone (FLONASE) 50 MCG/ACT nasal spray Place 2 sprays into both nostrils daily. 48 g 1   GRAPE SEED EXTRACT PO Take by mouth.     L-ARGININE PO Take by mouth.     levocetirizine (XYZAL) 5 MG tablet TAKE 1 TABLET BY MOUTH EVERY DAY IN THE EVENING 90 tablet 1   Misc Natural Products (PROSTATE HEALTH PO) Take by mouth.     Multiple Vitamin (MULTIVITAMIN) tablet Take 1 tablet by mouth daily.     niacin 500 MG tablet Take 500 mg by mouth 3 (three) times daily.     Omega-3 Fatty Acids (FISH OIL ULTRA PO) Take by mouth.     OVER THE COUNTER MEDICATION daily. OTC Amyloid Armour three caps daily     Potassium 99 MG TABS Take by mouth daily.     Probiotic Product (PROBIOTIC PO) Take by mouth. Daily     Resveratrol 100 MG CAPS Take 2 capsules by mouth  daily.     vitamin C (ASCORBIC ACID) 500 MG tablet Take by mouth.     Vitamin D-Vitamin K (VITAMIN K2-VITAMIN D3) 45-2000 MCG-UNIT CAPS Take by mouth.     EDARBI 80 MG TABS TAKE 1 TABLET BY MOUTH EVERY DAY 90 tablet 0   No facility-administered medications prior to visit.    ROS Review of Systems  Constitutional:  Negative for diaphoresis and fatigue.  HENT: Negative.    Eyes: Negative.   Respiratory:  Negative for cough, chest tightness, shortness of breath and wheezing.   Cardiovascular:  Negative for chest pain, palpitations and leg swelling.  Gastrointestinal:  Negative for abdominal pain, constipation, diarrhea, nausea and vomiting.  Endocrine: Negative.   Genitourinary: Negative.  Negative for difficulty urinating.  Musculoskeletal:  Negative for arthralgias and myalgias.  Skin: Negative.  Negative for color change.  Neurological: Negative.  Negative for dizziness, weakness, light-headedness, numbness and headaches.  Hematological:  Negative for adenopathy. Does not bruise/bleed easily.  Psychiatric/Behavioral: Negative.     Objective:  BP (!) 148/86 (BP Location: Left Arm, Patient Position: Sitting, Cuff Size: Large)   Pulse 81   Temp 97.6 F (36.4 C) (Oral)   Resp 16   Ht 5\' 9"  (1.753 m)   Wt 194 lb (88  kg)   SpO2 97%   BMI 28.65 kg/m   BP Readings from Last 3 Encounters:  08/21/20 (!) 148/86  07/24/19 (!) 142/88  01/02/19 (!) 142/74    Wt Readings from Last 3 Encounters:  08/21/20 194 lb (88 kg)  07/24/19 196 lb (88.9 kg)  01/02/19 185 lb (83.9 kg)    Physical Exam Vitals reviewed.  Constitutional:      Appearance: Normal appearance.  HENT:     Nose: Nose normal.     Mouth/Throat:     Pharynx: Oropharynx is clear.  Eyes:     General: No scleral icterus.    Conjunctiva/sclera: Conjunctivae normal.  Cardiovascular:     Rate and Rhythm: Normal rate and regular rhythm.     Heart sounds: S1 normal and S2 normal. Murmur heard.  Systolic murmur is  present with a grade of 2/6.    No friction rub.     Comments: EKG- NSR, 72 bpm Normal EKG Pulmonary:     Effort: Pulmonary effort is normal.     Breath sounds: No stridor. No wheezing, rhonchi or rales.  Abdominal:     General: Abdomen is flat. Bowel sounds are normal. There is no distension.     Palpations: Abdomen is soft. There is no hepatomegaly, splenomegaly or mass.     Tenderness: There is no abdominal tenderness.  Musculoskeletal:     Cervical back: Neck supple.     Right lower leg: No edema.     Left lower leg: No edema.  Lymphadenopathy:     Cervical: No cervical adenopathy.  Neurological:     General: No focal deficit present.     Mental Status: He is alert.    Lab Results  Component Value Date   WBC 6.6 08/21/2020   HGB 14.7 08/21/2020   HCT 43.1 08/21/2020   PLT 233.0 08/21/2020   GLUCOSE 101 (H) 08/21/2020   CHOL 233 (H) 08/21/2020   TRIG 96.0 08/21/2020   HDL 50.60 08/21/2020   LDLDIRECT 138.8 04/17/2012   LDLCALC 163 (H) 08/21/2020   ALT 18 08/21/2020   AST 21 08/21/2020   NA 134 (L) 08/21/2020   K 4.3 08/21/2020   CL 99 08/21/2020   CREATININE 1.22 08/21/2020   BUN 16 08/21/2020   CO2 29 08/21/2020   TSH 2.95 08/21/2020   PSA 1.30 01/03/2018   HGBA1C 5.9 08/21/2020   MICROALBUR 0.7 02/22/2006    MR Abdomen W Wo Contrast  Result Date: 02/10/2017 CLINICAL DATA:  Followup indeterminate left renal cystic lesion. EXAM: MRI ABDOMEN WITHOUT AND WITH CONTRAST TECHNIQUE: Multiplanar multisequence MR imaging of the abdomen was performed both before and after the administration of intravenous contrast. CONTRAST:  72mL MULTIHANCE GADOBENATE DIMEGLUMINE 529 MG/ML IV SOLN COMPARISON:  02/03/2016 and 07/10/2015 FINDINGS: Lower chest: No acute findings. Hepatobiliary: Stable 1.6 cm benign hemangioma in the right hepatic lobe. No other liver masses identified. Gallbladder is unremarkable. No evidence of biliary duct dilatation. Pancreas: No mass or inflammatory  changes. Pancreas divisum again noted. No evidence of pancreatic ductal dilatation. Stable tiny scattered less than 5 mm cystic foci within the pancreas, consistent with benign etiology. Spleen:  Within normal limits in size and appearance. Adrenals/Urinary Tract: Stable benign simple right renal cysts. A 1.7 cm subcapsular lesion in the anterior lower pole of the left kidney remains stable compared to previous studies. This lesion shows T1 and T2 hyperintensity and no evidence of contrast enhancement on subtraction imaging, consistent with benign Bosniak category 2 hemorrhagic  cyst. A few tiny sub-cm left renal cysts also noted. No evidence of hydronephrosis. Stomach/Bowel: Visualized portions within abdomen are unremarkable. Vascular/Lymphatic: No pathologically enlarged lymph nodes identified. No abdominal aortic aneurysm. Other:  None. Musculoskeletal:  No suspicious bone lesions identified. 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 Other simple renal cysts are also stable. No evidence of renal neoplasm. Stable small benign hepatic hemangioma. Electronically Signed   By: Earle Gell M.D.   On: 02/10/2017 10:22   IMPRESSIONS     1. Left ventricular ejection fraction, by estimation, is 60 to 65%. The  left ventricle has normal function. The left ventricle has no regional  wall motion abnormalities. Left ventricular diastolic parameters are  consistent with Grade I diastolic  dysfunction (impaired relaxation). Elevated left ventricular end-diastolic  pressure.   2. Right ventricular systolic function is normal. The right ventricular  size is normal. There is normal pulmonary artery systolic pressure.   3. The mitral valve is normal in structure. No evidence of mitral valve  regurgitation. No evidence of mitral stenosis.   4. The aortic valve is tricuspid. Aortic valve regurgitation is not  visualized. Mild to moderate aortic valve sclerosis/calcification is   present, without any evidence of aortic stenosis.   5. Aortic dilatation noted. There is mild dilatation of the aortic root,  ascending aorta and descening aorta measuring 37 mm, 7mm and 50mm  respectively.   6. The inferior vena cava is normal in size with greater than 50%  respiratory variability, suggesting right atrial pressure of 3 mmHg.   Assessment & Plan:   Kazimir was seen today for annual exam and hypertension.  Diagnoses and all orders for this visit:  Essential hypertension- His blood pressure is adequately well controlled. -     CBC with Differential/Platelet; Future -     Basic metabolic panel; Future -     TSH; Future -     Urinalysis, Routine w reflex microscopic; Future -     Azilsartan Medoxomil (EDARBI) 80 MG TABS; Take 1 tablet (80 mg total) by mouth daily. 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 -     Uric acid; Future -     Uric acid  Mixed hyperlipidemia- He is not willing to take a statin. -     Lipid panel; Future -     Hepatic function panel; Future -     Hepatic function panel -     Lipid panel  Stage 3a chronic kidney disease ([place])- His renal function is stable.  Will maintain control of his blood pressure and he will avoid nephrotoxic agents. -     Urinalysis, Routine w reflex microscopic; Future -     Urinalysis, Routine w reflex microscopic  Chronic hyperglycemia- His blood sugar is normal now. -     Basic metabolic panel; Future -     Hemoglobin A1c; Future -     Hemoglobin A1c -     Basic metabolic panel  Grade I diastolic dysfunction- He has a normal volume status.  Will continue to maintain control of his blood pressure.  I have changed Youlanda Roys. Younker's Edarbi. I am also having him maintain his aspirin EC, niacin, fluorouracil, vitamin C, b complex vitamins, OVER THE COUNTER  MEDICATION, Resveratrol, Potassium, Probiotic Product (PROBIOTIC PO), fluticasone, Vitamin K2-Vitamin D3, co-enzyme Q-10, multivitamin, L-ARGININE PO, Calcium-Magnesium-Vitamin D (CALCIUM MAGNESIUM PO), GRAPE SEED EXTRACT PO, Emollient (VITAMIN E WITH PANTHENOL EX), Misc Natural Products (PROSTATE HEALTH PO), Omega-3 Fatty Acids (FISH OIL ULTRA PO), and levocetirizine.  Meds ordered this encounter  Medications   Azilsartan Medoxomil (EDARBI) 80 MG TABS    Sig: Take 1 tablet (80 mg total) by mouth daily.    Dispense:  90 tablet    Refill:  1     Follow-up: Return in about 6 months (around 02/20/2021).  Scarlette Calico, MD

## 2020-08-22 DIAGNOSIS — Z0001 Encounter for general adult medical examination with abnormal findings: Secondary | ICD-10-CM | POA: Insufficient documentation

## 2020-08-22 DIAGNOSIS — R739 Hyperglycemia, unspecified: Secondary | ICD-10-CM | POA: Insufficient documentation

## 2020-08-22 DIAGNOSIS — I5189 Other ill-defined heart diseases: Secondary | ICD-10-CM | POA: Insufficient documentation

## 2020-09-17 ENCOUNTER — Telehealth: Payer: Self-pay | Admitting: Internal Medicine

## 2020-09-17 NOTE — Telephone Encounter (Signed)
Patient declined AWV 

## 2020-09-29 ENCOUNTER — Telehealth (INDEPENDENT_AMBULATORY_CARE_PROVIDER_SITE_OTHER): Payer: Medicare PPO | Admitting: Internal Medicine

## 2020-09-29 ENCOUNTER — Telehealth: Payer: Self-pay

## 2020-09-29 DIAGNOSIS — H1033 Unspecified acute conjunctivitis, bilateral: Secondary | ICD-10-CM

## 2020-09-29 DIAGNOSIS — J209 Acute bronchitis, unspecified: Secondary | ICD-10-CM | POA: Insufficient documentation

## 2020-09-29 DIAGNOSIS — H109 Unspecified conjunctivitis: Secondary | ICD-10-CM | POA: Insufficient documentation

## 2020-09-29 MED ORDER — CEFDINIR 300 MG PO CAPS: 300.0000 mg | ORAL_CAPSULE | Freq: Two times a day (BID) | ORAL | 0 refills | Status: DC

## 2020-09-29 MED ORDER — POLYMYXIN B-TRIMETHOPRIM 10000-0.1 UNIT/ML-% OP SOLN: 2.0000 [drp] | Freq: Four times a day (QID) | OPHTHALMIC | 0 refills | Status: DC

## 2020-09-29 NOTE — Assessment & Plan Note (Addendum)
Acute.  Likely bacterial.  Start Polytrim prescription both eyes.  Ophthalmology consultation if not better.

## 2020-09-29 NOTE — Telephone Encounter (Signed)
Pt has set-up a virtual visit to see Dr. Alain Marion due to sxs of Cough, congestion, b/l eye drainage and redness.  **Pt wife states their son did a COVID test on pt Friday 09/26/2020 in which it was NEG.

## 2020-09-29 NOTE — Progress Notes (Signed)
Virtual Visit via Video Note  I connected with Jeffrey Watkins on 09/29/20 at  4:00 PM EDT by a video enabled telemedicine application and verified that I am speaking with the correct person using two identifiers.   I discussed the limitations of evaluation and management by telemedicine and the availability of in person appointments. The patient expressed understanding and agreed to proceed.  I was located at our [facility] office. The patient was at home. There was no one else present in the visit.   History of Present Illness: Patient is complaining of cough without chest pain or shortness of breath of several days duration.  He is also complaining of conjunctivitis with purulent eye discharge in the morning that leads to crusting on the eyelids.   Observations/Objective: The patient appears to be in no acute distress, looks well.  Assessment and Plan:  See my Assessment and Plan. Follow Up Instructions:    I discussed the assessment and treatment plan with the patient. The patient was provided an opportunity to ask questions and all were answered. The patient agreed with the plan and demonstrated an understanding of the instructions.   The patient was advised to call back or seek an in-person evaluation if the symptoms worsen or if the condition fails to improve as anticipated.  I provided face-to-face time during this encounter. We were at different locations.   Walker Kehr, MD

## 2020-09-29 NOTE — Assessment & Plan Note (Addendum)
Acute.  Prescribed cefdinir by mouth.  Use over-the-counter cough syrup

## 2020-10-02 ENCOUNTER — Encounter: Payer: Medicare PPO | Admitting: Internal Medicine

## 2020-10-14 ENCOUNTER — Encounter: Payer: Self-pay | Admitting: Internal Medicine

## 2021-01-20 DIAGNOSIS — H5213 Myopia, bilateral: Secondary | ICD-10-CM | POA: Diagnosis not present

## 2021-01-20 DIAGNOSIS — H2513 Age-related nuclear cataract, bilateral: Secondary | ICD-10-CM | POA: Diagnosis not present

## 2021-01-26 DIAGNOSIS — L821 Other seborrheic keratosis: Secondary | ICD-10-CM | POA: Diagnosis not present

## 2021-01-26 DIAGNOSIS — Z85828 Personal history of other malignant neoplasm of skin: Secondary | ICD-10-CM | POA: Diagnosis not present

## 2021-01-26 DIAGNOSIS — D1801 Hemangioma of skin and subcutaneous tissue: Secondary | ICD-10-CM | POA: Diagnosis not present

## 2021-01-26 DIAGNOSIS — L570 Actinic keratosis: Secondary | ICD-10-CM | POA: Diagnosis not present

## 2021-02-18 ENCOUNTER — Other Ambulatory Visit: Payer: Self-pay

## 2021-02-18 ENCOUNTER — Encounter: Payer: Self-pay | Admitting: Internal Medicine

## 2021-02-18 ENCOUNTER — Ambulatory Visit: Payer: Medicare PPO | Admitting: Internal Medicine

## 2021-02-18 ENCOUNTER — Other Ambulatory Visit: Payer: Self-pay | Admitting: Internal Medicine

## 2021-02-18 VITALS — BP 144/88 | HR 73 | Temp 98.3°F | Ht 69.0 in | Wt 200.0 lb

## 2021-02-18 DIAGNOSIS — E785 Hyperlipidemia, unspecified: Secondary | ICD-10-CM

## 2021-02-18 DIAGNOSIS — E782 Mixed hyperlipidemia: Secondary | ICD-10-CM | POA: Diagnosis not present

## 2021-02-18 DIAGNOSIS — I10 Essential (primary) hypertension: Secondary | ICD-10-CM | POA: Diagnosis not present

## 2021-02-18 DIAGNOSIS — Z23 Encounter for immunization: Secondary | ICD-10-CM | POA: Diagnosis not present

## 2021-02-18 DIAGNOSIS — N1831 Chronic kidney disease, stage 3a: Secondary | ICD-10-CM | POA: Diagnosis not present

## 2021-02-18 DIAGNOSIS — E790 Hyperuricemia without signs of inflammatory arthritis and tophaceous disease: Secondary | ICD-10-CM

## 2021-02-18 LAB — CBC WITH DIFFERENTIAL/PLATELET
Basophils Absolute: 0 10*3/uL (ref 0.0–0.1)
Basophils Relative: 0.3 % (ref 0.0–3.0)
Eosinophils Absolute: 0.2 10*3/uL (ref 0.0–0.7)
Eosinophils Relative: 2.2 % (ref 0.0–5.0)
HCT: 43.7 % (ref 39.0–52.0)
Hemoglobin: 14.7 g/dL (ref 13.0–17.0)
Lymphocytes Relative: 23.6 % (ref 12.0–46.0)
Lymphs Abs: 1.6 10*3/uL (ref 0.7–4.0)
MCHC: 33.6 g/dL (ref 30.0–36.0)
MCV: 93.1 fl (ref 78.0–100.0)
Monocytes Absolute: 0.8 10*3/uL (ref 0.1–1.0)
Monocytes Relative: 10.9 % (ref 3.0–12.0)
Neutro Abs: 4.4 10*3/uL (ref 1.4–7.7)
Neutrophils Relative %: 63 % (ref 43.0–77.0)
Platelets: 246 10*3/uL (ref 150.0–400.0)
RBC: 4.69 Mil/uL (ref 4.22–5.81)
RDW: 13 % (ref 11.5–15.5)
WBC: 6.9 10*3/uL (ref 4.0–10.5)

## 2021-02-18 LAB — URINALYSIS, ROUTINE W REFLEX MICROSCOPIC
Bilirubin Urine: NEGATIVE
Ketones, ur: NEGATIVE
Leukocytes,Ua: NEGATIVE
Nitrite: NEGATIVE
Specific Gravity, Urine: 1.02 (ref 1.000–1.030)
Total Protein, Urine: NEGATIVE
Urine Glucose: NEGATIVE
Urobilinogen, UA: 0.2 (ref 0.0–1.0)
pH: 6 (ref 5.0–8.0)

## 2021-02-18 LAB — LIPID PANEL
Cholesterol: 235 mg/dL — ABNORMAL HIGH (ref 0–200)
HDL: 56.5 mg/dL (ref >39.00)
LDL Cholesterol: 161 mg/dL — ABNORMAL HIGH (ref 0–99)
NonHDL: 178.92
Total CHOL/HDL Ratio: 4
Triglycerides: 91 mg/dL (ref 0.0–149.0)
VLDL: 18.2 mg/dL (ref 0.0–40.0)

## 2021-02-18 LAB — BASIC METABOLIC PANEL
BUN: 15 mg/dL (ref 6–23)
CO2: 30 mEq/L (ref 19–32)
Calcium: 10.5 mg/dL (ref 8.4–10.5)
Chloride: 98 mEq/L (ref 96–112)
Creatinine, Ser: 1.21 mg/dL (ref 0.40–1.50)
GFR: 56.58 mL/min — ABNORMAL LOW (ref >60.00)
Glucose, Bld: 96 mg/dL (ref 70–99)
Potassium: 4.6 mEq/L (ref 3.5–5.1)
Sodium: 134 mEq/L — ABNORMAL LOW (ref 135–145)

## 2021-02-18 LAB — URIC ACID: Uric Acid, Serum: 6.8 mg/dL (ref 4.0–7.8)

## 2021-02-18 MED ORDER — PITAVASTATIN CALCIUM 1 MG PO TABS: 1.0000 mg | ORAL_TABLET | Freq: Every day | ORAL | 1 refills | Status: DC

## 2021-02-18 MED ORDER — EDARBI 80 MG PO TABS: 1.0000 | ORAL_TABLET | Freq: Every day | ORAL | 1 refills | Status: DC

## 2021-02-18 NOTE — Progress Notes (Signed)
Subjective:  Patient ID: Jeffrey Watkins, male    DOB: 10/30/1940  Age: 80 y.o. MRN: [id number]  CC: Hyperlipidemia  This visit occurred during the SARS-CoV-2 public health emergency.  Safety protocols were in place, including screening questions prior to the visit, additional usage of staff PPE, and extensive cleaning of exam room while observing appropriate contact time as indicated for disinfecting solutions.    HPI Jeffrey Watkins presents for f/up -  He is very active and denies chest pain, shortness of breath, diaphoresis, dizziness, lightheadedness, palpitations, or diaphoresis.  He is willing to try a low-dose of a statin.  Outpatient Medications Prior to Visit  Medication Sig Dispense Refill   b complex vitamins tablet Take 1 tablet by mouth daily.     Calcium-Magnesium-Vitamin D (CALCIUM MAGNESIUM PO) Take 2 tablets by mouth daily.     cefdinir (OMNICEF) 300 MG capsule Take 1 capsule (300 mg total) by mouth 2 (two) times daily. 20 capsule 0   co-enzyme Q-10 30 MG capsule Take by mouth.     Emollient (VITAMIN E WITH PANTHENOL EX) Apply topically.     fluorouracil (EFUDEX) 5 % cream APPLY DAILY TO FOREHEAD AND SCALP AS DIRECTED  0   fluticasone (FLONASE) 50 MCG/ACT nasal spray Place 2 sprays into both nostrils daily. 48 g 1   GRAPE SEED EXTRACT PO Take by mouth.     L-ARGININE PO Take by mouth.     levocetirizine (XYZAL) 5 MG tablet TAKE 1 TABLET BY MOUTH EVERY DAY IN THE EVENING 90 tablet 1   Misc Natural Products (PROSTATE HEALTH PO) Take by mouth.     Multiple Vitamin (MULTIVITAMIN) tablet Take 1 tablet by mouth daily.     niacin 500 MG tablet Take 500 mg by mouth 3 (three) times daily.     Omega-3 Fatty Acids (FISH OIL ULTRA PO) Take by mouth.     OVER THE COUNTER MEDICATION daily. OTC Amyloid Armour three caps daily     Potassium 99 MG TABS Take by mouth daily.     Probiotic Product (PROBIOTIC PO) Take by mouth. Daily     Resveratrol 100 MG CAPS Take 2 capsules by mouth  daily.     Vitamin D-Vitamin K (VITAMIN K2-VITAMIN D3) 45-2000 MCG-UNIT CAPS Take by mouth.     Azilsartan Medoxomil (EDARBI) 80 MG TABS Take 1 tablet (80 mg total) by mouth daily. 90 tablet 1   vitamin C (ASCORBIC ACID) 500 MG tablet Take by mouth.     trimethoprim-polymyxin b (POLYTRIM) ophthalmic solution Place 2 drops into both eyes in the morning, at noon, in the evening, and at bedtime. 10 mL 0   No facility-administered medications prior to visit.    ROS Review of Systems  Constitutional:  Negative for diaphoresis and fatigue.  HENT: Negative.    Eyes: Negative.   Respiratory:  Negative for cough, chest tightness, shortness of breath and wheezing.   Cardiovascular:  Negative for chest pain, palpitations and leg swelling.  Gastrointestinal:  Negative for abdominal pain, constipation, diarrhea, nausea and vomiting.  Endocrine: Negative.   Genitourinary: Negative.   Musculoskeletal:  Negative for arthralgias and myalgias.  Skin: Negative.   Neurological: Negative.  Negative for dizziness, weakness and numbness.  Hematological:  Negative for adenopathy. Does not bruise/bleed easily.  Psychiatric/Behavioral: Negative.     Objective:  BP (!) 144/88 (BP Location: Right Arm, Patient Position: Sitting, Cuff Size: Large)    Pulse 73    Temp 98.3 F (  36.8 C) (Oral)    Ht 5\' 9"  (1.753 m)    Wt 200 lb (90.7 kg)    SpO2 96%    BMI 29.53 kg/m   BP Readings from Last 3 Encounters:  02/18/21 (!) 144/88  08/21/20 (!) 148/86  07/24/19 (!) 142/88    Wt Readings from Last 3 Encounters:  02/18/21 200 lb (90.7 kg)  08/21/20 194 lb (88 kg)  07/24/19 196 lb (88.9 kg)    Physical Exam Vitals reviewed.  HENT:     Nose: Nose normal.     Mouth/Throat:     Mouth: Mucous membranes are moist.  Eyes:     General: No scleral icterus.    Conjunctiva/sclera: Conjunctivae normal.  Cardiovascular:     Rate and Rhythm: Normal rate and regular rhythm.     Heart sounds: S1 normal and S2 normal.  Murmur heard.  Systolic murmur is present with a grade of 1/6.  No diastolic murmur is present.    No gallop.  Pulmonary:     Effort: Pulmonary effort is normal.     Breath sounds: No stridor. No wheezing, rhonchi or rales.  Abdominal:     General: Abdomen is flat.     Palpations: There is no mass.     Tenderness: There is no abdominal tenderness. There is no guarding.     Hernia: No hernia is present.  Musculoskeletal:     Cervical back: Neck supple.     Right lower leg: No edema.     Left lower leg: No edema.  Lymphadenopathy:     Cervical: No cervical adenopathy.  Skin:    General: Skin is warm and dry.  Neurological:     General: No focal deficit present.     Mental Status: He is alert.  Psychiatric:        Mood and Affect: Mood normal.        Behavior: Behavior normal.    Lab Results  Component Value Date   WBC 6.9 02/18/2021   HGB 14.7 02/18/2021   HCT 43.7 02/18/2021   PLT 246.0 02/18/2021   GLUCOSE 96 02/18/2021   CHOL 235 (H) 02/18/2021   TRIG 91.0 02/18/2021   HDL 56.50 02/18/2021   LDLDIRECT 138.8 04/17/2012   LDLCALC 161 (H) 02/18/2021   ALT 18 08/21/2020   AST 21 08/21/2020   NA 134 (L) 02/18/2021   K 4.6 02/18/2021   CL 98 02/18/2021   CREATININE 1.21 02/18/2021   BUN 15 02/18/2021   CO2 30 02/18/2021   TSH 2.95 08/21/2020   PSA 1.30 01/03/2018   HGBA1C 5.9 08/21/2020   MICROALBUR 0.7 02/22/2006    MR Abdomen W Wo Contrast  Result Date: 02/10/2017 CLINICAL DATA:  Followup indeterminate left renal cystic lesion. EXAM: MRI ABDOMEN WITHOUT AND WITH CONTRAST TECHNIQUE: Multiplanar multisequence MR imaging of the abdomen was performed both before and after the administration of intravenous contrast. CONTRAST:  44mL MULTIHANCE GADOBENATE DIMEGLUMINE 529 MG/ML IV SOLN COMPARISON:  02/03/2016 and 07/10/2015 FINDINGS: Lower chest: No acute findings. Hepatobiliary: Stable 1.6 cm benign hemangioma in the right hepatic lobe. No other liver masses identified.  Gallbladder is unremarkable. No evidence of biliary duct dilatation. Pancreas: No mass or inflammatory changes. Pancreas divisum again noted. No evidence of pancreatic ductal dilatation. Stable tiny scattered less than 5 mm cystic foci within the pancreas, consistent with benign etiology. Spleen:  Within normal limits in size and appearance. Adrenals/Urinary Tract: Stable benign simple right renal cysts. A 1.7 cm subcapsular  lesion in the anterior lower pole of the left kidney remains stable compared to previous studies. This lesion shows T1 and T2 hyperintensity and no evidence of contrast enhancement on subtraction imaging, consistent with benign Bosniak category 2 hemorrhagic cyst. A few tiny sub-cm left renal cysts also noted. No evidence of hydronephrosis. Stomach/Bowel: Visualized portions within abdomen are unremarkable. Vascular/Lymphatic: No pathologically enlarged lymph nodes identified. No abdominal aortic aneurysm. Other:  None. Musculoskeletal:  No suspicious bone lesions identified. 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 Other simple renal cysts are also stable. No evidence of renal neoplasm. Stable small benign hepatic hemangioma. Electronically Signed   By: Earle Gell M.D.   On: 02/10/2017 10:22    Assessment & Plan:   Jeffrey Watkins was seen today for hyperlipidemia.  Diagnoses and all orders for this visit:  Stage 3a chronic kidney disease ([place])- His renal function is stable.  He will avoid nephrotoxic agents. -     CBC with Differential/Platelet; Future -     Basic metabolic panel; Future -     Urinalysis, Routine w reflex microscopic; Future -     Urinalysis, Routine w reflex microscopic -     Basic metabolic panel -     CBC with Differential/Platelet  Essential hypertension- His blood pressure is adequately well controlled. -     Azilsartan Medoxomil (EDARBI) 80 MG TABS; Take 1 tablet (80 mg total) by mouth daily. -     CBC with  Differential/Platelet; Future -     Basic metabolic panel; Future -     Urinalysis, Routine w reflex microscopic; Future -     Urinalysis, Routine w reflex microscopic -     Basic metabolic panel -     CBC with Differential/Platelet  Mixed hyperlipidemia -     Lipid panel; Future -     Lipid panel  Hyperuricemia- He has achieved his uric acid goal. -     Uric acid; Future -     Uric acid  Flu vaccine need -     Flu Vaccine QUAD High Dose(Fluad)  Hyperlipidemia with target LDL less than 130- I recommend that he take a statin for cardiovascular risk reduction. -     Pitavastatin Calcium 1 MG TABS; Take 1 tablet (1 mg total) by mouth daily.  I have discontinued Jondavid Schreier. Jeffrey Watkins vitamin C and trimethoprim-polymyxin b. I am also having him start on Pitavastatin Calcium. Additionally, I am having him maintain his niacin, fluorouracil, b complex vitamins, OVER THE COUNTER MEDICATION, Resveratrol, Potassium, Probiotic Product (PROBIOTIC PO), fluticasone, Vitamin K2-Vitamin D3, co-enzyme Q-10, multivitamin, L-ARGININE PO, Calcium-Magnesium-Vitamin D (CALCIUM MAGNESIUM PO), GRAPE SEED EXTRACT PO, Emollient (VITAMIN E WITH PANTHENOL EX), Misc Natural Products (PROSTATE HEALTH PO), Omega-3 Fatty Acids (FISH OIL ULTRA PO), levocetirizine, cefdinir, and Edarbi.  Meds ordered this encounter  Medications   Azilsartan Medoxomil (EDARBI) 80 MG TABS    Sig: Take 1 tablet (80 mg total) by mouth daily.    Dispense:  90 tablet    Refill:  1   Pitavastatin Calcium 1 MG TABS    Sig: Take 1 tablet (1 mg total) by mouth daily.    Dispense:  90 tablet    Refill:  1     Follow-up: Return in about 6 months (around 08/19/2021).  Scarlette Calico, MD

## 2021-02-18 NOTE — Patient Instructions (Signed)

## 2021-07-13 DIAGNOSIS — D1801 Hemangioma of skin and subcutaneous tissue: Secondary | ICD-10-CM | POA: Diagnosis not present

## 2021-07-13 DIAGNOSIS — L821 Other seborrheic keratosis: Secondary | ICD-10-CM | POA: Diagnosis not present

## 2021-07-13 DIAGNOSIS — D485 Neoplasm of uncertain behavior of skin: Secondary | ICD-10-CM | POA: Diagnosis not present

## 2021-07-13 DIAGNOSIS — C44329 Squamous cell carcinoma of skin of other parts of face: Secondary | ICD-10-CM | POA: Diagnosis not present

## 2021-07-13 DIAGNOSIS — L570 Actinic keratosis: Secondary | ICD-10-CM | POA: Diagnosis not present

## 2021-07-13 DIAGNOSIS — D2271 Melanocytic nevi of right lower limb, including hip: Secondary | ICD-10-CM | POA: Diagnosis not present

## 2021-07-13 DIAGNOSIS — Z85828 Personal history of other malignant neoplasm of skin: Secondary | ICD-10-CM | POA: Diagnosis not present

## 2021-08-18 DIAGNOSIS — Z85828 Personal history of other malignant neoplasm of skin: Secondary | ICD-10-CM | POA: Diagnosis not present

## 2021-08-18 DIAGNOSIS — C44329 Squamous cell carcinoma of skin of other parts of face: Secondary | ICD-10-CM | POA: Diagnosis not present

## 2021-08-26 ENCOUNTER — Encounter: Payer: Self-pay | Admitting: Internal Medicine

## 2021-08-26 ENCOUNTER — Ambulatory Visit (INDEPENDENT_AMBULATORY_CARE_PROVIDER_SITE_OTHER): Payer: Medicare PPO | Admitting: Internal Medicine

## 2021-08-26 VITALS — BP 138/74 | HR 87 | Temp 97.9°F | Resp 16 | Ht 69.0 in | Wt 191.0 lb

## 2021-08-26 DIAGNOSIS — I10 Essential (primary) hypertension: Secondary | ICD-10-CM | POA: Diagnosis not present

## 2021-08-26 DIAGNOSIS — Z0001 Encounter for general adult medical examination with abnormal findings: Secondary | ICD-10-CM

## 2021-08-26 DIAGNOSIS — E790 Hyperuricemia without signs of inflammatory arthritis and tophaceous disease: Secondary | ICD-10-CM

## 2021-08-26 DIAGNOSIS — N1831 Chronic kidney disease, stage 3a: Secondary | ICD-10-CM | POA: Diagnosis not present

## 2021-08-26 DIAGNOSIS — Z0000 Encounter for general adult medical examination without abnormal findings: Secondary | ICD-10-CM | POA: Diagnosis not present

## 2021-08-26 DIAGNOSIS — Z23 Encounter for immunization: Secondary | ICD-10-CM

## 2021-08-26 DIAGNOSIS — R739 Hyperglycemia, unspecified: Secondary | ICD-10-CM

## 2021-08-26 DIAGNOSIS — E785 Hyperlipidemia, unspecified: Secondary | ICD-10-CM

## 2021-08-26 LAB — URINALYSIS, ROUTINE W REFLEX MICROSCOPIC
Bilirubin Urine: NEGATIVE
Hgb urine dipstick: NEGATIVE
Ketones, ur: NEGATIVE
Leukocytes,Ua: NEGATIVE
Nitrite: NEGATIVE
RBC / HPF: NONE SEEN (ref 0–?)
Specific Gravity, Urine: 1.01 (ref 1.000–1.030)
Total Protein, Urine: NEGATIVE
Urine Glucose: NEGATIVE
Urobilinogen, UA: 0.2 (ref 0.0–1.0)
pH: 6.5 (ref 5.0–8.0)

## 2021-08-26 LAB — CBC WITH DIFFERENTIAL/PLATELET
Basophils Absolute: 0 10*3/uL (ref 0.0–0.1)
Basophils Relative: 0.4 % (ref 0.0–3.0)
Eosinophils Absolute: 0.3 10*3/uL (ref 0.0–0.7)
Eosinophils Relative: 3.7 % (ref 0.0–5.0)
HCT: 42.2 % (ref 39.0–52.0)
Hemoglobin: 14.3 g/dL (ref 13.0–17.0)
Lymphocytes Relative: 26.9 % (ref 12.0–46.0)
Lymphs Abs: 1.9 10*3/uL (ref 0.7–4.0)
MCHC: 34 g/dL (ref 30.0–36.0)
MCV: 93.7 fl (ref 78.0–100.0)
Monocytes Absolute: 0.7 10*3/uL (ref 0.1–1.0)
Monocytes Relative: 10.1 % (ref 3.0–12.0)
Neutro Abs: 4.1 10*3/uL (ref 1.4–7.7)
Neutrophils Relative %: 58.9 % (ref 43.0–77.0)
Platelets: 205 10*3/uL (ref 150.0–400.0)
RBC: 4.51 Mil/uL (ref 4.22–5.81)
RDW: 13.7 % (ref 11.5–15.5)
WBC: 7 10*3/uL (ref 4.0–10.5)

## 2021-08-26 LAB — URIC ACID: Uric Acid, Serum: 5.7 mg/dL (ref 4.0–7.8)

## 2021-08-26 LAB — LIPID PANEL
Cholesterol: 208 mg/dL — ABNORMAL HIGH (ref 0–200)
HDL: 52.3 mg/dL (ref >39.00)
LDL Cholesterol: 133 mg/dL — ABNORMAL HIGH (ref 0–99)
NonHDL: 156.15
Total CHOL/HDL Ratio: 4
Triglycerides: 118 mg/dL (ref 0.0–149.0)
VLDL: 23.6 mg/dL (ref 0.0–40.0)

## 2021-08-26 LAB — HEPATIC FUNCTION PANEL
ALT: 15 U/L (ref 0–53)
AST: 21 U/L (ref 0–37)
Albumin: 4.2 g/dL (ref 3.5–5.2)
Alkaline Phosphatase: 46 U/L (ref 39–117)
Bilirubin, Direct: 0.1 mg/dL (ref 0.0–0.3)
Total Bilirubin: 0.7 mg/dL (ref 0.2–1.2)
Total Protein: 6.6 g/dL (ref 6.0–8.3)

## 2021-08-26 LAB — BASIC METABOLIC PANEL
BUN: 14 mg/dL (ref 6–23)
CO2: 30 mEq/L (ref 19–32)
Calcium: 9.8 mg/dL (ref 8.4–10.5)
Chloride: 99 mEq/L (ref 96–112)
Creatinine, Ser: 1.2 mg/dL (ref 0.40–1.50)
GFR: 56.93 mL/min — ABNORMAL LOW (ref >60.00)
Glucose, Bld: 104 mg/dL — ABNORMAL HIGH (ref 70–99)
Potassium: 4.4 mEq/L (ref 3.5–5.1)
Sodium: 134 mEq/L — ABNORMAL LOW (ref 135–145)

## 2021-08-26 LAB — TSH: TSH: 2.5 u[IU]/mL (ref 0.35–5.50)

## 2021-08-26 LAB — HEMOGLOBIN A1C: Hgb A1c MFr Bld: 5.8 % (ref 4.6–6.5)

## 2021-08-26 MED ORDER — SHINGRIX 50 MCG/0.5ML IM SUSR: 0.5000 mL | Freq: Once | INTRAMUSCULAR | 1 refills | Status: AC

## 2021-08-26 MED ORDER — PITAVASTATIN CALCIUM 2 MG PO TABS: 2.0000 mg | ORAL_TABLET | Freq: Every day | ORAL | 1 refills | Status: DC

## 2021-08-26 NOTE — Patient Instructions (Signed)
Health Maintenance, Male Adopting a healthy lifestyle and getting preventive care are important in promoting health and wellness. Ask your health care provider about: The right schedule for you to have regular tests and exams. Things you can do on your own to prevent diseases and keep yourself healthy. What should I know about diet, weight, and exercise? Eat a healthy diet  Eat a diet that includes plenty of vegetables, fruits, low-fat dairy products, and lean protein. Do not eat a lot of foods that are high in solid fats, added sugars, or sodium. Maintain a healthy weight Body mass index (BMI) is a measurement that can be used to identify possible weight problems. It estimates body fat based on height and weight. Your health care provider can help determine your BMI and help you achieve or maintain a healthy weight. Get regular exercise Get regular exercise. This is one of the most important things you can do for your health. Most adults should: Exercise for at least 150 minutes each week. The exercise should increase your heart rate and make you sweat (moderate-intensity exercise). Do strengthening exercises at least twice a week. This is in addition to the moderate-intensity exercise. Spend less time sitting. Even light physical activity can be beneficial. Watch cholesterol and blood lipids Have your blood tested for lipids and cholesterol at 81 years of age, then have this test every 5 years. You may need to have your cholesterol levels checked more often if: Your lipid or cholesterol levels are high. You are older than 81 years of age. You are at high risk for heart disease. What should I know about cancer screening? Many types of cancers can be detected early and may often be prevented. Depending on your health history and family history, you may need to have cancer screening at various ages. This may include screening for: Colorectal cancer. Prostate cancer. Skin cancer. Lung  cancer. What should I know about heart disease, diabetes, and high blood pressure? Blood pressure and heart disease High blood pressure causes heart disease and increases the risk of stroke. This is more likely to develop in people who have high blood pressure readings or are overweight. Talk with your health care provider about your target blood pressure readings. Have your blood pressure checked: Every 3-5 years if you are 18-39 years of age. Every year if you are 40 years old or older. If you are between the ages of 65 and 75 and are a current or former smoker, ask your health care provider if you should have a one-time screening for abdominal aortic aneurysm (AAA). Diabetes Have regular diabetes screenings. This checks your fasting blood sugar level. Have the screening done: Once every three years after age 45 if you are at a normal weight and have a low risk for diabetes. More often and at a younger age if you are overweight or have a high risk for diabetes. What should I know about preventing infection? Hepatitis B If you have a higher risk for hepatitis B, you should be screened for this virus. Talk with your health care provider to find out if you are at risk for hepatitis B infection. Hepatitis C Blood testing is recommended for: Everyone born from 1945 through 1965. Anyone with known risk factors for hepatitis C. Sexually transmitted infections (STIs) You should be screened each year for STIs, including gonorrhea and chlamydia, if: You are sexually active and are younger than 81 years of age. You are older than 81 years of age and your   health care provider tells you that you are at risk for this type of infection. Your sexual activity has changed since you were last screened, and you are at increased risk for chlamydia or gonorrhea. Ask your health care provider if you are at risk. Ask your health care provider about whether you are at high risk for HIV. Your health care provider  may recommend a prescription medicine to help prevent HIV infection. If you choose to take medicine to prevent HIV, you should first get tested for HIV. You should then be tested every 3 months for as long as you are taking the medicine. Follow these instructions at home: Alcohol use Do not drink alcohol if your health care provider tells you not to drink. If you drink alcohol: Limit how much you have to 0-2 drinks a day. Know how much alcohol is in your drink. In the [place] one drink equals one 12 oz bottle of beer (355 mL), one 5 oz glass of wine (148 mL), or one 1 oz glass of hard liquor (44 mL). Lifestyle Do not use any products that contain nicotine or tobacco. These products include cigarettes, chewing tobacco, and vaping devices, such as e-cigarettes. If you need help quitting, ask your health care provider. Do not use street drugs. Do not share needles. Ask your health care provider for help if you need support or information about quitting drugs. General instructions Schedule regular health, dental, and eye exams. Stay current with your vaccines. Tell your health care provider if: You often feel depressed. You have ever been abused or do not feel safe at home. Summary Adopting a healthy lifestyle and getting preventive care are important in promoting health and wellness. Follow your health care provider's instructions about healthy diet, exercising, and getting tested or screened for diseases. Follow your health care provider's instructions on monitoring your cholesterol and blood pressure. This information is not intended to replace advice given to you by your health care provider. Make sure you discuss any questions you have with your health care provider. Document Revised: 07/14/2020 Document Reviewed: 07/14/2020 Elsevier Patient Education  2023 [employer]vier Inc.  

## 2021-08-26 NOTE — Progress Notes (Signed)
Subjective:  Patient ID: Jeffrey Watkins, male    DOB: 1940/07/20  Age: 81 y.o. MRN: [id number]  CC: Annual Exam and Hypertension   HPI [name] presents for a CPX and f/up -  He recently did a 7 mile hike and did not experience chest pain, shortness of breath, diaphoresis, dizziness, lightheadedness, or edema.  Outpatient Medications Prior to Visit  Medication Sig Dispense Refill   Azilsartan Medoxomil (EDARBI) 80 MG TABS Take 1 tablet (80 mg total) by mouth daily. 90 tablet 1   b complex vitamins tablet Take 1 tablet by mouth daily.     Calcium-Magnesium-Vitamin D (CALCIUM MAGNESIUM PO) Take 2 tablets by mouth daily.     co-enzyme Q-10 30 MG capsule Take by mouth.     Emollient (VITAMIN E WITH PANTHENOL EX) Apply topically.     fluorouracil (EFUDEX) 5 % cream APPLY DAILY TO FOREHEAD AND SCALP AS DIRECTED  0   fluticasone (FLONASE) 50 MCG/ACT nasal spray Place 2 sprays into both nostrils daily. 48 g 1   GRAPE SEED EXTRACT PO Take by mouth.     L-ARGININE PO Take by mouth.     levocetirizine (XYZAL) 5 MG tablet TAKE 1 TABLET BY MOUTH EVERY DAY IN THE EVENING 90 tablet 1   Misc Natural Products (PROSTATE HEALTH PO) Take by mouth.     Multiple Vitamin (MULTIVITAMIN) tablet Take 1 tablet by mouth daily.     niacin 500 MG tablet Take 500 mg by mouth 3 (three) times daily.     Omega-3 Fatty Acids (FISH OIL ULTRA PO) Take by mouth.     OVER THE COUNTER MEDICATION daily. OTC Amyloid Armour three caps daily     Potassium 99 MG TABS Take by mouth daily.     Probiotic Product (PROBIOTIC PO) Take by mouth. Daily     Resveratrol 100 MG CAPS Take 2 capsules by mouth daily.     Vitamin D-Vitamin K (VITAMIN K2-VITAMIN D3) 45-2000 MCG-UNIT CAPS Take by mouth.     cefdinir (OMNICEF) 300 MG capsule Take 1 capsule (300 mg total) by mouth 2 (two) times daily. 20 capsule 0   Pitavastatin Calcium 1 MG TABS Take 1 tablet (1 mg total) by mouth daily. 90 tablet 1   No facility-administered  medications prior to visit.    ROS Review of Systems  Constitutional: Negative.  Negative for chills, diaphoresis, fatigue and fever.  HENT: Negative.    Eyes: Negative.   Respiratory:  Negative for cough, chest tightness, shortness of breath and wheezing.   Cardiovascular:  Negative for chest pain, palpitations and leg swelling.  Gastrointestinal:  Negative for abdominal pain, diarrhea, nausea and vomiting.  Endocrine: Negative.   Genitourinary: Negative.  Negative for difficulty urinating.  Musculoskeletal: Negative.  Negative for arthralgias and myalgias.  Skin: Negative.   Neurological:  Negative for dizziness, weakness, light-headedness and headaches.  Hematological:  Negative for adenopathy. Does not bruise/bleed easily.  Psychiatric/Behavioral: Negative.      Objective:  BP 138/74 (BP Location: Right Arm, Patient Position: Sitting, Cuff Size: Large)   Pulse 87   Temp 97.9 F (36.6 C) (Oral)   Resp 16   Ht '5\' 9"'$  (1.753 m)   Wt 191 lb (86.6 kg)   SpO2 98%   BMI 28.21 kg/m   BP Readings from Last 3 Encounters:  08/26/21 138/74  02/18/21 (!) 144/88  08/21/20 (!) 148/86    Wt Readings from Last 3 Encounters:  08/26/21 191 lb (86.6 kg)  02/18/21 200 lb (90.7 kg)  08/21/20 194 lb (88 kg)    Physical Exam Vitals reviewed.  HENT:     Nose: Nose normal.     Mouth/Throat:     Mouth: Mucous membranes are moist.  Eyes:     General: No scleral icterus.    Conjunctiva/sclera: Conjunctivae normal.  Cardiovascular:     Rate and Rhythm: Normal rate and regular rhythm.     Heart sounds: Murmur heard.     Systolic murmur is present with a grade of 1/6.     No diastolic murmur is present.     No gallop.  Pulmonary:     Effort: Pulmonary effort is normal.     Breath sounds: No stridor. No wheezing, rhonchi or rales.  Abdominal:     General: Abdomen is flat.     Palpations: There is no mass.     Tenderness: There is no abdominal tenderness. There is no guarding.      Hernia: No hernia is present.  Musculoskeletal:     Cervical back: Neck supple.     Right lower leg: No edema.     Left lower leg: No edema.  Lymphadenopathy:     Cervical: No cervical adenopathy.  Skin:    General: Skin is warm and dry.  Neurological:     General: No focal deficit present.     Mental Status: He is alert.  Psychiatric:        Mood and Affect: Mood normal.        Behavior: Behavior normal.     Lab Results  Component Value Date   WBC 7.0 08/26/2021   HGB 14.3 08/26/2021   HCT 42.2 08/26/2021   PLT 205.0 08/26/2021   GLUCOSE 104 (H) 08/26/2021   CHOL 208 (H) 08/26/2021   TRIG 118.0 08/26/2021   HDL 52.30 08/26/2021   LDLDIRECT 138.8 04/17/2012   LDLCALC 133 (H) 08/26/2021   ALT 15 08/26/2021   AST 21 08/26/2021   NA 134 (L) 08/26/2021   K 4.4 08/26/2021   CL 99 08/26/2021   CREATININE 1.20 08/26/2021   BUN 14 08/26/2021   CO2 30 08/26/2021   TSH 2.50 08/26/2021   PSA 1.30 01/03/2018   HGBA1C 5.8 08/26/2021   MICROALBUR 0.7 02/22/2006    MR Abdomen W Wo Contrast  Result Date: 02/10/2017 CLINICAL DATA:  Followup indeterminate left renal cystic lesion. EXAM: MRI ABDOMEN WITHOUT AND WITH CONTRAST TECHNIQUE: Multiplanar multisequence MR imaging of the abdomen was performed both before and after the administration of intravenous contrast. CONTRAST:  81m MULTIHANCE GADOBENATE DIMEGLUMINE 529 MG/ML IV SOLN COMPARISON:  02/03/2016 and 07/10/2015 FINDINGS: Lower chest: No acute findings. Hepatobiliary: Stable 1.6 cm benign hemangioma in the right hepatic lobe. No other liver masses identified. Gallbladder is unremarkable. No evidence of biliary duct dilatation. Pancreas: No mass or inflammatory changes. Pancreas divisum again noted. No evidence of pancreatic ductal dilatation. Stable tiny scattered less than 5 mm cystic foci within the pancreas, consistent with benign etiology. Spleen:  Within normal limits in size and appearance. Adrenals/Urinary Tract: Stable  benign simple right renal cysts. A 1.7 cm subcapsular lesion in the anterior lower pole of the left kidney remains stable compared to previous studies. This lesion shows T1 and T2 hyperintensity and no evidence of contrast enhancement on subtraction imaging, consistent with benign Bosniak category 2 hemorrhagic cyst. A few tiny sub-cm left renal cysts also noted. No evidence of hydronephrosis. Stomach/Bowel: Visualized portions within abdomen are unremarkable. Vascular/Lymphatic: No  pathologically enlarged lymph nodes identified. No abdominal aortic aneurysm. Other:  None. Musculoskeletal:  No suspicious bone lesions identified. 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 Other simple renal cysts are also stable. No evidence of renal neoplasm. Stable small benign hepatic hemangioma. Electronically Signed   By: Earle Gell M.D.   On: 02/10/2017 10:22    Assessment & Plan:   Rustin was seen today for annual exam and hypertension.  Diagnoses and all orders for this visit:  Essential hypertension-his blood pressure is well controlled. 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 -     Basic metabolic panel; Future -     Urinalysis, Routine w reflex microscopic; Future -     CBC with Differential/Platelet; Future -     CBC with Differential/Platelet -     Urinalysis, Routine w reflex microscopic -     Basic metabolic panel  Hyperlipidemia with target LDL less than 130-I have asked him to take a statin for cardiovascular risk reduction. -     Lipid panel; Future -     TSH; Future -     Hepatic function panel; Future -     Hepatic function panel -     TSH -      Lipid panel -     Pitavastatin Calcium 2 MG TABS; Take 1 tablet (2 mg total) by mouth daily.  Hyperuricemia-uric acid level is normal. -     Uric acid; Future -     Uric acid  Hyperglycemia-A1c is 5.8%. -     Basic metabolic panel; Future -     Hemoglobin A1c; Future -     Hemoglobin A1c -     Basic metabolic panel  Encounter for general adult medical examination with abnormal findings-exam completed, labs reviewed, vaccines reviewed and updated, no cancer screenings indicated, patient education was given.  Need for prophylactic vaccination and inoculation against varicella -     Zoster Vaccine Adjuvanted [facility]) injection; Inject 0.5 mLs into the muscle once for 1 dose.   I have discontinued Makenzie Vittorio. Radigan's cefdinir and Pitavastatin Calcium. I am also having him start on Shingrix and Pitavastatin Calcium. Additionally, I am having him maintain his niacin, fluorouracil, b complex vitamins, OVER THE COUNTER MEDICATION, Resveratrol, Potassium, Probiotic Product (PROBIOTIC PO), fluticasone, Vitamin K2-Vitamin D3, co-enzyme Q-10, multivitamin, L-ARGININE PO, Calcium-Magnesium-Vitamin D (CALCIUM MAGNESIUM PO), GRAPE SEED EXTRACT PO, Emollient (VITAMIN E WITH PANTHENOL EX), Misc Natural Products (PROSTATE HEALTH PO), Omega-3 Fatty Acids (FISH OIL ULTRA PO), levocetirizine, and Edarbi.  Meds ordered this encounter  Medications   Zoster Vaccine Adjuvanted [facility]) injection    Sig: Inject 0.5 mLs into the muscle once for 1 dose.    Dispense:  0.5 mL    Refill:  1   Pitavastatin Calcium 2 MG TABS    Sig: Take 1 tablet (2 mg total) by mouth daily.    Dispense:  90 tablet    Refill:  1     Follow-up: Return in about 6 months (around 02/25/2022).  Scarlette Calico, MD

## 2021-08-27 ENCOUNTER — Telehealth: Payer: Self-pay | Admitting: Internal Medicine

## 2021-08-27 NOTE — Telephone Encounter (Signed)
Maxine called and stated her and Copeland discussed a new rx with Dr. Ronnald Ramp at their visit on 08/26/21. Pranav would like rosuvastatin (CRESTOR) 5 MG tablet in place of Pitavastatin Calcium 2 MG TABS.  [id number] [place]  [phone] Fax:  [phone]     Please advise.

## 2021-08-31 ENCOUNTER — Other Ambulatory Visit: Payer: Self-pay | Admitting: Internal Medicine

## 2021-08-31 DIAGNOSIS — E785 Hyperlipidemia, unspecified: Secondary | ICD-10-CM

## 2021-08-31 MED ORDER — ROSUVASTATIN CALCIUM 5 MG PO TABS: 5.0000 mg | ORAL_TABLET | Freq: Every day | ORAL | 1 refills | Status: DC

## 2021-09-02 ENCOUNTER — Telehealth: Payer: Self-pay | Admitting: *Deleted

## 2021-09-02 NOTE — Telephone Encounter (Signed)
REc'd fax PA for Livalo, but according to pt chart pt saw MD yesterday. Med was d/c he is taking crestor 5 mg.Marland KitchenJohny Watkins

## 2021-11-05 ENCOUNTER — Telehealth: Payer: Self-pay | Admitting: Internal Medicine

## 2021-11-05 DIAGNOSIS — I10 Essential (primary) hypertension: Secondary | ICD-10-CM

## 2021-11-05 NOTE — Telephone Encounter (Signed)
Patient needs a refill on his Earnest Rosier - please send to [facility] on [address], [place]

## 2021-11-06 MED ORDER — EDARBI 80 MG PO TABS: 1.0000 | ORAL_TABLET | Freq: Every day | ORAL | 0 refills | Status: DC

## 2021-11-12 ENCOUNTER — Other Ambulatory Visit: Payer: Self-pay | Admitting: Internal Medicine

## 2021-11-12 DIAGNOSIS — I10 Essential (primary) hypertension: Secondary | ICD-10-CM

## 2021-11-12 MED ORDER — EDARBI 80 MG PO TABS: 1.0000 | ORAL_TABLET | Freq: Every day | ORAL | 0 refills | Status: DC

## 2021-11-12 NOTE — Telephone Encounter (Signed)
Patient is still checking on this prescription.  Patient is going out of town and needs this today

## 2022-01-20 DIAGNOSIS — L821 Other seborrheic keratosis: Secondary | ICD-10-CM | POA: Diagnosis not present

## 2022-01-20 DIAGNOSIS — D692 Other nonthrombocytopenic purpura: Secondary | ICD-10-CM | POA: Diagnosis not present

## 2022-01-20 DIAGNOSIS — D0439 Carcinoma in situ of skin of other parts of face: Secondary | ICD-10-CM | POA: Diagnosis not present

## 2022-01-20 DIAGNOSIS — L720 Epidermal cyst: Secondary | ICD-10-CM | POA: Diagnosis not present

## 2022-01-20 DIAGNOSIS — L309 Dermatitis, unspecified: Secondary | ICD-10-CM | POA: Diagnosis not present

## 2022-01-20 DIAGNOSIS — L738 Other specified follicular disorders: Secondary | ICD-10-CM | POA: Diagnosis not present

## 2022-01-20 DIAGNOSIS — D485 Neoplasm of uncertain behavior of skin: Secondary | ICD-10-CM | POA: Diagnosis not present

## 2022-01-20 DIAGNOSIS — D1801 Hemangioma of skin and subcutaneous tissue: Secondary | ICD-10-CM | POA: Diagnosis not present

## 2022-01-20 DIAGNOSIS — L570 Actinic keratosis: Secondary | ICD-10-CM | POA: Diagnosis not present

## 2022-01-20 DIAGNOSIS — Z85828 Personal history of other malignant neoplasm of skin: Secondary | ICD-10-CM | POA: Diagnosis not present

## 2022-02-08 ENCOUNTER — Other Ambulatory Visit: Payer: Self-pay | Admitting: Internal Medicine

## 2022-02-08 DIAGNOSIS — I10 Essential (primary) hypertension: Secondary | ICD-10-CM

## 2022-02-17 ENCOUNTER — Encounter: Payer: Self-pay | Admitting: Internal Medicine

## 2022-02-17 ENCOUNTER — Ambulatory Visit: Payer: Medicare PPO | Admitting: Internal Medicine

## 2022-02-17 VITALS — BP 142/82 | HR 100 | Temp 97.6°F | Resp 16 | Ht 69.0 in | Wt 194.0 lb

## 2022-02-17 DIAGNOSIS — Z23 Encounter for immunization: Secondary | ICD-10-CM

## 2022-02-17 DIAGNOSIS — I10 Essential (primary) hypertension: Secondary | ICD-10-CM

## 2022-02-17 DIAGNOSIS — N1831 Chronic kidney disease, stage 3a: Secondary | ICD-10-CM

## 2022-02-17 LAB — URINALYSIS, ROUTINE W REFLEX MICROSCOPIC
Bilirubin Urine: NEGATIVE
Hgb urine dipstick: NEGATIVE
Ketones, ur: NEGATIVE
Leukocytes,Ua: NEGATIVE
Nitrite: NEGATIVE
Specific Gravity, Urine: 1.015 (ref 1.000–1.030)
Total Protein, Urine: NEGATIVE
Urine Glucose: NEGATIVE
Urobilinogen, UA: 0.2 (ref 0.0–1.0)
pH: 6.5 (ref 5.0–8.0)

## 2022-02-17 LAB — CBC WITH DIFFERENTIAL/PLATELET
Basophils Absolute: 0 10*3/uL (ref 0.0–0.1)
Basophils Relative: 0.4 % (ref 0.0–3.0)
Eosinophils Absolute: 0.2 10*3/uL (ref 0.0–0.7)
Eosinophils Relative: 2.5 % (ref 0.0–5.0)
HCT: 44.5 % (ref 39.0–52.0)
Hemoglobin: 15.1 g/dL (ref 13.0–17.0)
Lymphocytes Relative: 25.5 % (ref 12.0–46.0)
Lymphs Abs: 1.9 10*3/uL (ref 0.7–4.0)
MCHC: 33.9 g/dL (ref 30.0–36.0)
MCV: 94.9 fl (ref 78.0–100.0)
Monocytes Absolute: 0.8 10*3/uL (ref 0.1–1.0)
Monocytes Relative: 11 % (ref 3.0–12.0)
Neutro Abs: 4.4 10*3/uL (ref 1.4–7.7)
Neutrophils Relative %: 60.6 % (ref 43.0–77.0)
Platelets: 235 10*3/uL (ref 150.0–400.0)
RBC: 4.69 Mil/uL (ref 4.22–5.81)
RDW: 13.1 % (ref 11.5–15.5)
WBC: 7.3 10*3/uL (ref 4.0–10.5)

## 2022-02-17 LAB — BASIC METABOLIC PANEL
BUN: 15 mg/dL (ref 6–23)
CO2: 32 mEq/L (ref 19–32)
Calcium: 9.9 mg/dL (ref 8.4–10.5)
Chloride: 98 mEq/L (ref 96–112)
Creatinine, Ser: 1.17 mg/dL (ref 0.40–1.50)
GFR: 58.49 mL/min — ABNORMAL LOW (ref >60.00)
Glucose, Bld: 110 mg/dL — ABNORMAL HIGH (ref 70–99)
Potassium: 4.9 mEq/L (ref 3.5–5.1)
Sodium: 135 mEq/L (ref 135–145)

## 2022-02-17 MED ORDER — EDARBI 80 MG PO TABS: 1.0000 | ORAL_TABLET | Freq: Every day | ORAL | 1 refills | Status: DC

## 2022-02-17 NOTE — Patient Instructions (Signed)
Hypertension, Adult High blood pressure (hypertension) is when the force of blood pumping through the arteries is too strong. The arteries are the blood vessels that carry blood from the heart throughout the body. Hypertension forces the heart to work harder to pump blood and may cause arteries to become narrow or stiff. Untreated or uncontrolled hypertension can lead to a heart attack, heart failure, a stroke, kidney disease, and other problems. A blood pressure reading consists of a higher number over a lower number. Ideally, your blood pressure should be below 120/80. The first ("top") number is called the systolic pressure. It is a measure of the pressure in your arteries as your heart beats. The second ("bottom") number is called the diastolic pressure. It is a measure of the pressure in your arteries as the heart relaxes. What are the causes? The exact cause of this condition is not known. There are some conditions that result in high blood pressure. What increases the risk? Certain factors may make you more likely to develop high blood pressure. Some of these risk factors are under your control, including: Smoking. Not getting enough exercise or physical activity. Being overweight. Having too much fat, sugar, calories, or salt (sodium) in your diet. Drinking too much alcohol. Other risk factors include: Having a personal history of heart disease, diabetes, high cholesterol, or kidney disease. Stress. Having a family history of high blood pressure and high cholesterol. Having obstructive sleep apnea. Age. The risk increases with age. What are the signs or symptoms? High blood pressure may not cause symptoms. Very high blood pressure (hypertensive crisis) may cause: Headache. Fast or irregular heartbeats (palpitations). Shortness of breath. Nosebleed. Nausea and vomiting. Vision changes. Severe chest pain, dizziness, and seizures. How is this diagnosed? This condition is diagnosed by  measuring your blood pressure while you are seated, with your arm resting on a flat surface, your legs uncrossed, and your feet flat on the floor. The cuff of the blood pressure monitor will be placed directly against the skin of your upper arm at the level of your heart. Blood pressure should be measured at least twice using the same arm. Certain conditions can cause a difference in blood pressure between your right and left arms. If you have a high blood pressure reading during one visit or you have normal blood pressure with other risk factors, you may be asked to: Return on a different day to have your blood pressure checked again. Monitor your blood pressure at home for 1 week or longer. If you are diagnosed with hypertension, you may have other blood or imaging tests to help your health care provider understand your overall risk for other conditions. How is this treated? This condition is treated by making healthy lifestyle changes, such as eating healthy foods, exercising more, and reducing your alcohol intake. You may be referred for counseling on a healthy diet and physical activity. Your health care provider may prescribe medicine if lifestyle changes are not enough to get your blood pressure under control and if: Your systolic blood pressure is above 130. Your diastolic blood pressure is above 80. Your personal target blood pressure may vary depending on your medical conditions, your age, and other factors. Follow these instructions at home: Eating and drinking  Eat a diet that is high in fiber and potassium, and low in sodium, added sugar, and fat. An example of this eating plan is called the DASH diet. DASH stands for Dietary Approaches to Stop Hypertension. To eat this way: Eat   plenty of fresh fruits and vegetables. Try to fill one half of your plate at each meal with fruits and vegetables. Eat whole grains, such as whole-wheat pasta, brown rice, or whole-grain bread. Fill about one  fourth of your plate with whole grains. Eat or drink low-fat dairy products, such as skim milk or low-fat yogurt. Avoid fatty cuts of meat, processed or cured meats, and poultry with skin. Fill about one fourth of your plate with lean proteins, such as fish, chicken without skin, beans, eggs, or tofu. Avoid pre-made and processed foods. These tend to be higher in sodium, added sugar, and fat. Reduce your daily sodium intake. Many people with hypertension should eat less than 1,500 mg of sodium a day. Do not drink alcohol if: Your health care provider tells you not to drink. You are pregnant, may be pregnant, or are planning to become pregnant. If you drink alcohol: Limit how much you have to: 0-1 drink a day for women. 0-2 drinks a day for men. Know how much alcohol is in your drink. In the [place] one drink equals one 12 oz bottle of beer (355 mL), one 5 oz glass of wine (148 mL), or one 1 oz glass of hard liquor (44 mL). Lifestyle  Work with your health care provider to maintain a healthy body weight or to lose weight. Ask what an ideal weight is for you. Get at least 30 minutes of exercise that causes your heart to beat faster (aerobic exercise) most days of the week. Activities may include walking, swimming, or biking. Include exercise to strengthen your muscles (resistance exercise), such as Pilates or lifting weights, as part of your weekly exercise routine. Try to do these types of exercises for 30 minutes at least 3 days a week. Do not use any products that contain nicotine or tobacco. These products include cigarettes, chewing tobacco, and vaping devices, such as e-cigarettes. If you need help quitting, ask your health care provider. Monitor your blood pressure at home as told by your health care provider. Keep all follow-up visits. This is important. Medicines Take over-the-counter and prescription medicines only as told by your health care provider. Follow directions carefully. Blood  pressure medicines must be taken as prescribed. Do not skip doses of blood pressure medicine. Doing this puts you at risk for problems and can make the medicine less effective. Ask your health care provider about side effects or reactions to medicines that you should watch for. Contact a health care provider if you: Think you are having a reaction to a medicine you are taking. Have headaches that keep coming back (recurring). Feel dizzy. Have swelling in your ankles. Have trouble with your vision. Get help right away if you: Develop a severe headache or confusion. Have unusual weakness or numbness. Feel faint. Have severe pain in your chest or abdomen. Vomit repeatedly. Have trouble breathing. These symptoms may be an emergency. Get help right away. Call 911. Do not wait to see if the symptoms will go away. Do not drive yourself to the hospital. Summary Hypertension is when the force of blood pumping through your arteries is too strong. If this condition is not controlled, it may put you at risk for serious complications. Your personal target blood pressure may vary depending on your medical conditions, your age, and other factors. For most people, a normal blood pressure is less than 120/80. Hypertension is treated with lifestyle changes, medicines, or a combination of both. Lifestyle changes include losing weight, eating a healthy,   low-sodium diet, exercising more, and limiting alcohol. This information is not intended to replace advice given to you by your health care provider. Make sure you discuss any questions you have with your health care provider. Document Revised: 12/30/2020 Document Reviewed: 12/30/2020 Elsevier Patient Education  2023 [employer] Inc.  

## 2022-02-17 NOTE — Progress Notes (Signed)
Subjective:  Patient ID: Jeffrey Watkins, male    DOB: [birth year]  Age: 81 y.o. MRN: [id number]  CC: Hypertension   HPI [name] presents for f/up -  He is active and denies chest pain, shortness of breath, diaphoresis, palpitations, or edema.  Outpatient Medications Prior to Visit  Medication Sig Dispense Refill   b complex vitamins tablet Take 1 tablet by mouth daily.     Calcium-Magnesium-Vitamin D (CALCIUM MAGNESIUM PO) Take 2 tablets by mouth daily.     co-enzyme Q-10 30 MG capsule Take by mouth.     Emollient (VITAMIN E WITH PANTHENOL EX) Apply topically.     fluorouracil (EFUDEX) 5 % cream APPLY DAILY TO FOREHEAD AND SCALP AS DIRECTED  0   fluticasone (FLONASE) 50 MCG/ACT nasal spray Place 2 sprays into both nostrils daily. 48 g 1   GRAPE SEED EXTRACT PO Take by mouth.     L-ARGININE PO Take by mouth.     levocetirizine (XYZAL) 5 MG tablet TAKE 1 TABLET BY MOUTH EVERY DAY IN THE EVENING 90 tablet 1   Misc Natural Products (PROSTATE HEALTH PO) Take by mouth.     Multiple Vitamin (MULTIVITAMIN) tablet Take 1 tablet by mouth daily.     niacin 500 MG tablet Take 500 mg by mouth 3 (three) times daily.     Omega-3 Fatty Acids (FISH OIL ULTRA PO) Take by mouth.     OVER THE COUNTER MEDICATION daily. OTC Amyloid Armour three caps daily     Potassium 99 MG TABS Take by mouth daily.     Probiotic Product (PROBIOTIC PO) Take by mouth. Daily     Resveratrol 100 MG CAPS Take 2 capsules by mouth daily.     rosuvastatin (CRESTOR) 5 MG tablet Take 1 tablet (5 mg total) by mouth daily. 90 tablet 1   Vitamin D-Vitamin K (VITAMIN K2-VITAMIN D3) 45-2000 MCG-UNIT CAPS Take by mouth.     EDARBI 80 MG TABS TAKE 1 TABLET BY MOUTH EVERY DAY 90 tablet 0   No facility-administered medications prior to visit.    ROS Review of Systems  Constitutional: Negative.  Negative for diaphoresis and fatigue.  HENT: Negative.    Eyes:  Negative for visual disturbance.  Respiratory:  Negative for  cough, chest tightness and wheezing.   Cardiovascular:  Negative for chest pain, palpitations and leg swelling.  Gastrointestinal:  Negative for abdominal pain, diarrhea, nausea and vomiting.  Endocrine: Negative.   Genitourinary: Negative.   Musculoskeletal: Negative.  Negative for arthralgias and myalgias.  Skin: Negative.   Allergic/Immunologic: Negative.   Neurological: Negative.  Negative for dizziness, weakness and light-headedness.  Hematological:  Negative for adenopathy. Does not bruise/bleed easily.  Psychiatric/Behavioral: Negative.      Objective:  BP (!) 142/82 (BP Location: Left Arm, Patient Position: Sitting, Cuff Size: Large)   Pulse 100   Temp 97.6 F (36.4 C) (Oral)   Resp 16   Ht '5\' 9"'$  (1.753 m)   Wt 194 lb (88 kg)   SpO2 97%   BMI 28.65 kg/m   BP Readings from Last 3 Encounters:  02/17/22 (!) 142/82  08/26/21 138/74  02/18/21 (!) 144/88    Wt Readings from Last 3 Encounters:  02/17/22 194 lb (88 kg)  08/26/21 191 lb (86.6 kg)  02/18/21 200 lb (90.7 kg)    Physical Exam Vitals reviewed.  HENT:     Mouth/Throat:     Mouth: Mucous membranes are moist.  Eyes:  General: No scleral icterus.    Conjunctiva/sclera: Conjunctivae normal.  Cardiovascular:     Rate and Rhythm: Normal rate and regular rhythm.     Heart sounds: Murmur heard.     Systolic murmur is present with a grade of 2/6.  Pulmonary:     Effort: Pulmonary effort is normal.     Breath sounds: No stridor. No wheezing, rhonchi or rales.  Abdominal:     General: Abdomen is flat.     Palpations: There is no mass.     Tenderness: There is no abdominal tenderness. There is no guarding.     Hernia: No hernia is present.  Musculoskeletal:        General: Normal range of motion.     Cervical back: Neck supple.     Right lower leg: No edema.     Left lower leg: No edema.  Lymphadenopathy:     Cervical: No cervical adenopathy.  Skin:    General: Skin is warm and dry.  Neurological:      General: No focal deficit present.     Mental Status: He is alert.  Psychiatric:        Mood and Affect: Mood normal.        Behavior: Behavior normal.     Lab Results  Component Value Date   WBC 7.3 02/17/2022   HGB 15.1 02/17/2022   HCT 44.5 02/17/2022   PLT 235.0 02/17/2022   GLUCOSE 110 (H) 02/17/2022   CHOL 208 (H) 08/26/2021   TRIG 118.0 08/26/2021   HDL 52.30 08/26/2021   LDLDIRECT 138.8 04/17/2012   LDLCALC 133 (H) 08/26/2021   ALT 15 08/26/2021   AST 21 08/26/2021   NA 135 02/17/2022   K 4.9 02/17/2022   CL 98 02/17/2022   CREATININE 1.17 02/17/2022   BUN 15 02/17/2022   CO2 32 02/17/2022   TSH 2.50 08/26/2021   PSA 1.30 01/03/2018   HGBA1C 5.8 08/26/2021   MICROALBUR 0.7 02/22/2006    MR Abdomen W Wo Contrast  Result Date: 02/10/2017 CLINICAL DATA:  Followup indeterminate left renal cystic lesion. EXAM: MRI ABDOMEN WITHOUT AND WITH CONTRAST TECHNIQUE: Multiplanar multisequence MR imaging of the abdomen was performed both before and after the administration of intravenous contrast. CONTRAST:  63m MULTIHANCE GADOBENATE DIMEGLUMINE 529 MG/ML IV SOLN COMPARISON:  02/03/2016 and 07/10/2015 FINDINGS: Lower chest: No acute findings. Hepatobiliary: Stable 1.6 cm benign hemangioma in the right hepatic lobe. No other liver masses identified. Gallbladder is unremarkable. No evidence of biliary duct dilatation. Pancreas: No mass or inflammatory changes. Pancreas divisum again noted. No evidence of pancreatic ductal dilatation. Stable tiny scattered less than 5 mm cystic foci within the pancreas, consistent with benign etiology. Spleen:  Within normal limits in size and appearance. Adrenals/Urinary Tract: Stable benign simple right renal cysts. A 1.7 cm subcapsular lesion in the anterior lower pole of the left kidney remains stable compared to previous studies. This lesion shows T1 and T2 hyperintensity and no evidence of contrast enhancement on subtraction imaging, consistent  with benign Bosniak category 2 hemorrhagic cyst. A few tiny sub-cm left renal cysts also noted. No evidence of hydronephrosis. Stomach/Bowel: Visualized portions within abdomen are unremarkable. Vascular/Lymphatic: No pathologically enlarged lymph nodes identified. No abdominal aortic aneurysm. Other:  None. Musculoskeletal:  No suspicious bone lesions identified. 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 Other simple renal cysts are also stable. No evidence of renal neoplasm. Stable small benign hepatic hemangioma. Electronically Signed  By: Earle Gell M.D.   On: 02/10/2017 10:22    Assessment & Plan:   Sladen was seen today for hypertension.  Diagnoses and all orders for this visit:  Stage 3a chronic kidney disease ([place])- Her renal function is stable. -     Basic metabolic panel; Future -     Urinalysis, Routine w reflex microscopic; Future -     Urinalysis, Routine w reflex microscopic -     Basic metabolic panel  Essential hypertension- Blood pressure is adequately well-controlled.  Electrolytes are normal. -     Basic metabolic panel; Future -     CBC with Differential/Platelet; Future -     Urinalysis, Routine w reflex microscopic; Future -     Urinalysis, Routine w reflex microscopic -     CBC with Differential/Platelet -     Basic metabolic panel -     Azilsartan Medoxomil (EDARBI) 80 MG TABS; Take 1 tablet (80 mg total) by mouth daily.  Flu vaccine need -     Flu Vaccine QUAD High Dose(Fluad)  Need for vaccination for zoster -     Zoster Vaccine Adjuvanted [facility]) injection; Inject 0.5 mLs into the muscle once for 1 dose.   I have changed Youlanda Roys. Leverett's Edarbi. I am also having him start on Shingrix. Additionally, I am having him maintain his niacin, fluorouracil, b complex vitamins, OVER THE COUNTER MEDICATION, Resveratrol, Potassium, Probiotic Product (PROBIOTIC PO), fluticasone, Vitamin K2-Vitamin D3, co-enzyme Q-10, multivitamin,  L-ARGININE PO, Calcium-Magnesium-Vitamin D (CALCIUM MAGNESIUM PO), GRAPE SEED EXTRACT PO, Emollient (VITAMIN E WITH PANTHENOL EX), Misc Natural Products (PROSTATE HEALTH PO), Omega-3 Fatty Acids (FISH OIL ULTRA PO), levocetirizine, and rosuvastatin.  Meds ordered this encounter  Medications   Azilsartan Medoxomil (EDARBI) 80 MG TABS    Sig: Take 1 tablet (80 mg total) by mouth daily.    Dispense:  90 tablet    Refill:  1   Zoster Vaccine Adjuvanted [facility]) injection    Sig: Inject 0.5 mLs into the muscle once for 1 dose.    Dispense:  0.5 mL    Refill:  1     Follow-up: Return in about 6 months (around 08/19/2022).  Scarlette Calico, MD

## 2022-02-20 MED ORDER — SHINGRIX 50 MCG/0.5ML IM SUSR: 0.5000 mL | Freq: Once | INTRAMUSCULAR | 1 refills | Status: AC

## 2022-04-27 DIAGNOSIS — H5211 Myopia, right eye: Secondary | ICD-10-CM | POA: Diagnosis not present

## 2022-04-27 DIAGNOSIS — H2513 Age-related nuclear cataract, bilateral: Secondary | ICD-10-CM | POA: Diagnosis not present

## 2022-05-11 ENCOUNTER — Other Ambulatory Visit: Payer: Self-pay | Admitting: Internal Medicine

## 2022-05-11 DIAGNOSIS — I10 Essential (primary) hypertension: Secondary | ICD-10-CM

## 2022-05-28 ENCOUNTER — Other Ambulatory Visit: Payer: Self-pay | Admitting: Internal Medicine

## 2022-05-28 DIAGNOSIS — E785 Hyperlipidemia, unspecified: Secondary | ICD-10-CM

## 2022-07-21 DIAGNOSIS — D485 Neoplasm of uncertain behavior of skin: Secondary | ICD-10-CM | POA: Diagnosis not present

## 2022-07-21 DIAGNOSIS — C4441 Basal cell carcinoma of skin of scalp and neck: Secondary | ICD-10-CM | POA: Diagnosis not present

## 2022-07-21 DIAGNOSIS — L570 Actinic keratosis: Secondary | ICD-10-CM | POA: Diagnosis not present

## 2022-07-21 DIAGNOSIS — Z85828 Personal history of other malignant neoplasm of skin: Secondary | ICD-10-CM | POA: Diagnosis not present

## 2022-07-21 DIAGNOSIS — D225 Melanocytic nevi of trunk: Secondary | ICD-10-CM | POA: Diagnosis not present

## 2022-07-21 DIAGNOSIS — L2089 Other atopic dermatitis: Secondary | ICD-10-CM | POA: Diagnosis not present

## 2022-07-21 DIAGNOSIS — L821 Other seborrheic keratosis: Secondary | ICD-10-CM | POA: Diagnosis not present

## 2022-07-21 DIAGNOSIS — L812 Freckles: Secondary | ICD-10-CM | POA: Diagnosis not present

## 2022-08-24 ENCOUNTER — Ambulatory Visit: Payer: Medicare PPO | Admitting: Internal Medicine

## 2022-08-24 ENCOUNTER — Encounter: Payer: Self-pay | Admitting: Internal Medicine

## 2022-08-24 VITALS — BP 136/88 | HR 96 | Temp 97.8°F | Ht 69.0 in | Wt 192.0 lb

## 2022-08-24 DIAGNOSIS — N1831 Chronic kidney disease, stage 3a: Secondary | ICD-10-CM | POA: Diagnosis not present

## 2022-08-24 DIAGNOSIS — I10 Essential (primary) hypertension: Secondary | ICD-10-CM

## 2022-08-24 DIAGNOSIS — E785 Hyperlipidemia, unspecified: Secondary | ICD-10-CM | POA: Diagnosis not present

## 2022-08-24 DIAGNOSIS — E790 Hyperuricemia without signs of inflammatory arthritis and tophaceous disease: Secondary | ICD-10-CM | POA: Diagnosis not present

## 2022-08-24 DIAGNOSIS — R351 Nocturia: Secondary | ICD-10-CM | POA: Diagnosis not present

## 2022-08-24 DIAGNOSIS — N401 Enlarged prostate with lower urinary tract symptoms: Secondary | ICD-10-CM

## 2022-08-24 LAB — BASIC METABOLIC PANEL
BUN: 15 mg/dL (ref 6–23)
CO2: 29 mEq/L (ref 19–32)
Calcium: 10.1 mg/dL (ref 8.4–10.5)
Chloride: 96 mEq/L (ref 96–112)
Creatinine, Ser: 1.14 mg/dL (ref 0.40–1.50)
GFR: 60.13 mL/min (ref >60.00)
Glucose, Bld: 99 mg/dL (ref 70–99)
Potassium: 4.8 mEq/L (ref 3.5–5.1)
Sodium: 133 mEq/L — ABNORMAL LOW (ref 135–145)

## 2022-08-24 LAB — CBC WITH DIFFERENTIAL/PLATELET
Basophils Absolute: 0 10*3/uL (ref 0.0–0.1)
Basophils Relative: 0.3 % (ref 0.0–3.0)
Eosinophils Absolute: 0.1 10*3/uL (ref 0.0–0.7)
Eosinophils Relative: 1.7 % (ref 0.0–5.0)
HCT: 45.1 % (ref 39.0–52.0)
Hemoglobin: 15 g/dL (ref 13.0–17.0)
Lymphocytes Relative: 24.9 % (ref 12.0–46.0)
Lymphs Abs: 1.8 10*3/uL (ref 0.7–4.0)
MCHC: 33.4 g/dL (ref 30.0–36.0)
MCV: 95.6 fl (ref 78.0–100.0)
Monocytes Absolute: 0.6 10*3/uL (ref 0.1–1.0)
Monocytes Relative: 9 % (ref 3.0–12.0)
Neutro Abs: 4.6 10*3/uL (ref 1.4–7.7)
Neutrophils Relative %: 64.1 % (ref 43.0–77.0)
Platelets: 239 10*3/uL (ref 150.0–400.0)
RBC: 4.71 Mil/uL (ref 4.22–5.81)
RDW: 13.5 % (ref 11.5–15.5)
WBC: 7.1 10*3/uL (ref 4.0–10.5)

## 2022-08-24 LAB — HEPATIC FUNCTION PANEL
ALT: 19 U/L (ref 0–53)
AST: 24 U/L (ref 0–37)
Albumin: 4.8 g/dL (ref 3.5–5.2)
Alkaline Phosphatase: 45 U/L (ref 39–117)
Bilirubin, Direct: 0.1 mg/dL (ref 0.0–0.3)
Total Bilirubin: 0.7 mg/dL (ref 0.2–1.2)
Total Protein: 7.4 g/dL (ref 6.0–8.3)

## 2022-08-24 LAB — LIPID PANEL
Cholesterol: 205 mg/dL — ABNORMAL HIGH (ref 0–200)
HDL: 61.5 mg/dL (ref >39.00)
LDL Cholesterol: 126 mg/dL — ABNORMAL HIGH (ref 0–99)
NonHDL: 143.43
Total CHOL/HDL Ratio: 3
Triglycerides: 85 mg/dL (ref 0.0–149.0)
VLDL: 17 mg/dL (ref 0.0–40.0)

## 2022-08-24 LAB — URINALYSIS, ROUTINE W REFLEX MICROSCOPIC
Bilirubin Urine: NEGATIVE
Ketones, ur: NEGATIVE
Leukocytes,Ua: NEGATIVE
Nitrite: NEGATIVE
Specific Gravity, Urine: 1.015 (ref 1.000–1.030)
Total Protein, Urine: NEGATIVE
Urine Glucose: NEGATIVE
Urobilinogen, UA: 0.2 (ref 0.0–1.0)
pH: 6.5 (ref 5.0–8.0)

## 2022-08-24 LAB — PSA: PSA: 2.34 ng/mL (ref 0.10–4.00)

## 2022-08-24 LAB — URIC ACID: Uric Acid, Serum: 6 mg/dL (ref 4.0–7.8)

## 2022-08-24 NOTE — Progress Notes (Unsigned)
Subjective:  Patient ID: Jeffrey Watkins, male    DOB: 06-03-1940  Age: 82 y.o. MRN: [id number]  CC: Hypertension and Hyperlipidemia   HPI Jeffrey Watkins presents for f/up ----  Discussed the use of AI scribe software for clinical note transcription with the patient, who gave verbal consent to proceed.  History of Present Illness   The patient, who has been on a regimen of statin and blood pressure medication, initiated a new exercise routine approximately a week and a half ago. The routine includes walking, treadmill use, and leg lifts, aimed at strengthening the legs, shoulders, and back. The patient reports no adverse symptoms during exercise, such as chest pain, shortness of breath, dizziness, or lightheadedness.  The patient occasionally monitors his blood pressure and has not noted any concerns of it being too high or too low. He denies experiencing palpitations or any abnormal heart rate. There is no reported swelling in the legs or feet.  The patient rarely takes pain medication, only in instances of infrequent headaches. He denies any muscle aches potentially caused by the statin medication. The patient also denies any side effects from the blood pressure medication, such as coughing, wheezing, or shortness of breath. The initiation of the gym routine was due to an inability to get outside during the day because of the sun.        Outpatient Medications Prior to Visit  Medication Sig Dispense Refill   b complex vitamins tablet Take 1 tablet by mouth daily.     Calcium-Magnesium-Vitamin D (CALCIUM MAGNESIUM PO) Take 2 tablets by mouth daily.     co-enzyme Q-10 30 MG capsule Take by mouth.     EDARBI 80 MG TABS TAKE 1 TABLET BY MOUTH EVERY DAY 90 tablet 1   Emollient (VITAMIN E WITH PANTHENOL EX) Apply topically.     fluorouracil (EFUDEX) 5 % cream APPLY DAILY TO FOREHEAD AND SCALP AS DIRECTED  0   fluticasone (FLONASE) 50 MCG/ACT nasal spray Place 2 sprays into both nostrils  daily. 48 g 1   GRAPE SEED EXTRACT PO Take by mouth.     L-ARGININE PO Take by mouth.     levocetirizine (XYZAL) 5 MG tablet TAKE 1 TABLET BY MOUTH EVERY DAY IN THE EVENING 90 tablet 1   Misc Natural Products (PROSTATE HEALTH PO) Take by mouth.     Multiple Vitamin (MULTIVITAMIN) tablet Take 1 tablet by mouth daily.     niacin 500 MG tablet Take 500 mg by mouth 3 (three) times daily.     Omega-3 Fatty Acids (FISH OIL ULTRA PO) Take by mouth.     OVER THE COUNTER MEDICATION daily. OTC Amyloid Armour three caps daily     Potassium 99 MG TABS Take by mouth daily.     Probiotic Product (PROBIOTIC PO) Take by mouth. Daily     Resveratrol 100 MG CAPS Take 2 capsules by mouth daily.     rosuvastatin (CRESTOR) 5 MG tablet TAKE 1 TABLET (5 MG TOTAL) BY MOUTH DAILY. 90 tablet 1   Vitamin D-Vitamin K (VITAMIN K2-VITAMIN D3) 45-2000 MCG-UNIT CAPS Take by mouth.     No facility-administered medications prior to visit.    ROS Review of Systems  Objective:  BP 136/88 (BP Location: Left Arm, Patient Position: Sitting, Cuff Size: Large)   Pulse 96   Temp 97.8 F (36.6 C) (Oral)   Ht 5\' 9"  (1.753 m)   Wt 192 lb (87.1 kg)   SpO2 96%  BMI 28.35 kg/m   BP Readings from Last 3 Encounters:  08/24/22 136/88  02/17/22 (!) 142/82  08/26/21 138/74    Wt Readings from Last 3 Encounters:  08/24/22 192 lb (87.1 kg)  02/17/22 194 lb (88 kg)  08/26/21 191 lb (86.6 kg)    Physical Exam  Lab Results  Component Value Date   WBC 7.1 08/24/2022   HGB 15.0 08/24/2022   HCT 45.1 08/24/2022   PLT 239.0 08/24/2022   GLUCOSE 99 08/24/2022   CHOL 205 (H) 08/24/2022   TRIG 85.0 08/24/2022   HDL 61.50 08/24/2022   LDLDIRECT 138.8 04/17/2012   LDLCALC 126 (H) 08/24/2022   ALT 19 08/24/2022   AST 24 08/24/2022   NA 133 (L) 08/24/2022   K 4.8 08/24/2022   CL 96 08/24/2022   CREATININE 1.14 08/24/2022   BUN 15 08/24/2022   CO2 29 08/24/2022   TSH 2.50 08/26/2021   PSA 2.34 08/24/2022   HGBA1C 5.8  08/26/2021   MICROALBUR 0.7 02/22/2006    MR Abdomen W Wo Contrast  Result Date: 02/10/2017 CLINICAL DATA:  Followup indeterminate left renal cystic lesion. EXAM: MRI ABDOMEN WITHOUT AND WITH CONTRAST TECHNIQUE: Multiplanar multisequence MR imaging of the abdomen was performed both before and after the administration of intravenous contrast. CONTRAST:  20mL MULTIHANCE GADOBENATE DIMEGLUMINE 529 MG/ML IV SOLN COMPARISON:  02/03/2016 and 07/10/2015 FINDINGS: Lower chest: No acute findings. Hepatobiliary: Stable 1.6 cm benign hemangioma in the right hepatic lobe. No other liver masses identified. Gallbladder is unremarkable. No evidence of biliary duct dilatation. Pancreas: No mass or inflammatory changes. Pancreas divisum again noted. No evidence of pancreatic ductal dilatation. Stable tiny scattered less than 5 mm cystic foci within the pancreas, consistent with benign etiology. Spleen:  Within normal limits in size and appearance. Adrenals/Urinary Tract: Stable benign simple right renal cysts. A 1.7 cm subcapsular lesion in the anterior lower pole of the left kidney remains stable compared to previous studies. This lesion shows T1 and T2 hyperintensity and no evidence of contrast enhancement on subtraction imaging, consistent with benign Bosniak category 2 hemorrhagic cyst. A few tiny sub-cm left renal cysts also noted. No evidence of hydronephrosis. Stomach/Bowel: Visualized portions within abdomen are unremarkable. Vascular/Lymphatic: No pathologically enlarged lymph nodes identified. No abdominal aortic aneurysm. Other:  None. Musculoskeletal:  No suspicious bone lesions identified. 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 Other simple renal cysts are also stable. No evidence of renal neoplasm. Stable small benign hepatic hemangioma. Electronically Signed   By: Myles Rosenthal M.D.   On: 02/10/2017 10:22    Assessment & Plan:  Essential hypertension -     Basic  metabolic panel; Future -     CBC with Differential/Platelet; Future -     Hepatic function panel; Future  BPH associated with nocturia -     PSA; Future -     Urinalysis, Routine w reflex microscopic; Future  Hyperlipidemia with target LDL less than 130 -     Lipid panel; Future -     Lipoprotein A (LPA); Future -     Hepatic function panel; Future  Hyperuricemia -     Uric acid; Future  Stage 3a chronic kidney disease (HCC) -     Basic metabolic panel; Future -     Urinalysis, Routine w reflex microscopic; Future     Follow-up: Return in about 6 months (around 02/23/2023).  Sanda Linger, MD

## 2022-08-24 NOTE — Patient Instructions (Signed)
Hypertension, Adult High blood pressure (hypertension) is when the force of blood pumping through the arteries is too strong. The arteries are the blood vessels that carry blood from the heart throughout the body. Hypertension forces the heart to work harder to pump blood and may cause arteries to become narrow or stiff. Untreated or uncontrolled hypertension can lead to a heart attack, heart failure, a stroke, kidney disease, and other problems. A blood pressure reading consists of a higher number over a lower number. Ideally, your blood pressure should be below 120/80. The first ("top") number is called the systolic pressure. It is a measure of the pressure in your arteries as your heart beats. The second ("bottom") number is called the diastolic pressure. It is a measure of the pressure in your arteries as the heart relaxes. What are the causes? The exact cause of this condition is not known. There are some conditions that result in high blood pressure. What increases the risk? Certain factors may make you more likely to develop high blood pressure. Some of these risk factors are under your control, including: Smoking. Not getting enough exercise or physical activity. Being overweight. Having too much fat, sugar, calories, or salt (sodium) in your diet. Drinking too much alcohol. Other risk factors include: Having a personal history of heart disease, diabetes, high cholesterol, or kidney disease. Stress. Having a family history of high blood pressure and high cholesterol. Having obstructive sleep apnea. Age. The risk increases with age. What are the signs or symptoms? High blood pressure may not cause symptoms. Very high blood pressure (hypertensive crisis) may cause: Headache. Fast or irregular heartbeats (palpitations). Shortness of breath. Nosebleed. Nausea and vomiting. Vision changes. Severe chest pain, dizziness, and seizures. How is this diagnosed? This condition is diagnosed by  measuring your blood pressure while you are seated, with your arm resting on a flat surface, your legs uncrossed, and your feet flat on the floor. The cuff of the blood pressure monitor will be placed directly against the skin of your upper arm at the level of your heart. Blood pressure should be measured at least twice using the same arm. Certain conditions can cause a difference in blood pressure between your right and left arms. If you have a high blood pressure reading during one visit or you have normal blood pressure with other risk factors, you may be asked to: Return on a different day to have your blood pressure checked again. Monitor your blood pressure at home for 1 week or longer. If you are diagnosed with hypertension, you may have other blood or imaging tests to help your health care provider understand your overall risk for other conditions. How is this treated? This condition is treated by making healthy lifestyle changes, such as eating healthy foods, exercising more, and reducing your alcohol intake. You may be referred for counseling on a healthy diet and physical activity. Your health care provider may prescribe medicine if lifestyle changes are not enough to get your blood pressure under control and if: Your systolic blood pressure is above 130. Your diastolic blood pressure is above 80. Your personal target blood pressure may vary depending on your medical conditions, your age, and other factors. Follow these instructions at home: Eating and drinking  Eat a diet that is high in fiber and potassium, and low in sodium, added sugar, and fat. An example of this eating plan is called the DASH diet. DASH stands for Dietary Approaches to Stop Hypertension. To eat this way: Eat   plenty of fresh fruits and vegetables. Try to fill one half of your plate at each meal with fruits and vegetables. Eat whole grains, such as whole-wheat pasta, brown rice, or whole-grain bread. Fill about one  fourth of your plate with whole grains. Eat or drink low-fat dairy products, such as skim milk or low-fat yogurt. Avoid fatty cuts of meat, processed or cured meats, and poultry with skin. Fill about one fourth of your plate with lean proteins, such as fish, chicken without skin, beans, eggs, or tofu. Avoid pre-made and processed foods. These tend to be higher in sodium, added sugar, and fat. Reduce your daily sodium intake. Many people with hypertension should eat less than 1,500 mg of sodium a day. Do not drink alcohol if: Your health care provider tells you not to drink. You are pregnant, may be pregnant, or are planning to become pregnant. If you drink alcohol: Limit how much you have to: 0-1 drink a day for women. 0-2 drinks a day for men. Know how much alcohol is in your drink. In the [place] one drink equals one 12 oz bottle of beer (355 mL), one 5 oz glass of wine (148 mL), or one 1 oz glass of hard liquor (44 mL). Lifestyle  Work with your health care provider to maintain a healthy body weight or to lose weight. Ask what an ideal weight is for you. Get at least 30 minutes of exercise that causes your heart to beat faster (aerobic exercise) most days of the week. Activities may include walking, swimming, or biking. Include exercise to strengthen your muscles (resistance exercise), such as Pilates or lifting weights, as part of your weekly exercise routine. Try to do these types of exercises for 30 minutes at least 3 days a week. Do not use any products that contain nicotine or tobacco. These products include cigarettes, chewing tobacco, and vaping devices, such as e-cigarettes. If you need help quitting, ask your health care provider. Monitor your blood pressure at home as told by your health care provider. Keep all follow-up visits. This is important. Medicines Take over-the-counter and prescription medicines only as told by your health care provider. Follow directions carefully. Blood  pressure medicines must be taken as prescribed. Do not skip doses of blood pressure medicine. Doing this puts you at risk for problems and can make the medicine less effective. Ask your health care provider about side effects or reactions to medicines that you should watch for. Contact a health care provider if you: Think you are having a reaction to a medicine you are taking. Have headaches that keep coming back (recurring). Feel dizzy. Have swelling in your ankles. Have trouble with your vision. Get help right away if you: Develop a severe headache or confusion. Have unusual weakness or numbness. Feel faint. Have severe pain in your chest or abdomen. Vomit repeatedly. Have trouble breathing. These symptoms may be an emergency. Get help right away. Call 911. Do not wait to see if the symptoms will go away. Do not drive yourself to the hospital. Summary Hypertension is when the force of blood pumping through your arteries is too strong. If this condition is not controlled, it may put you at risk for serious complications. Your personal target blood pressure may vary depending on your medical conditions, your age, and other factors. For most people, a normal blood pressure is less than 120/80. Hypertension is treated with lifestyle changes, medicines, or a combination of both. Lifestyle changes include losing weight, eating a healthy,   low-sodium diet, exercising more, and limiting alcohol. This information is not intended to replace advice given to you by your health care provider. Make sure you discuss any questions you have with your health care provider. Document Revised: 12/30/2020 Document Reviewed: 12/30/2020 [employer] Patient Education  2024 [employer]  

## 2022-08-27 LAB — LIPOPROTEIN A (LPA): Lipoprotein (a): 22 nmol/L (ref <75)

## 2022-09-01 DIAGNOSIS — Z85828 Personal history of other malignant neoplasm of skin: Secondary | ICD-10-CM | POA: Diagnosis not present

## 2022-09-01 DIAGNOSIS — C4441 Basal cell carcinoma of skin of scalp and neck: Secondary | ICD-10-CM | POA: Diagnosis not present

## 2022-11-11 ENCOUNTER — Other Ambulatory Visit: Payer: Self-pay | Admitting: Internal Medicine

## 2022-11-11 DIAGNOSIS — I10 Essential (primary) hypertension: Secondary | ICD-10-CM

## 2022-12-07 ENCOUNTER — Other Ambulatory Visit: Payer: Self-pay | Admitting: Internal Medicine

## 2022-12-07 DIAGNOSIS — E785 Hyperlipidemia, unspecified: Secondary | ICD-10-CM

## 2023-02-10 DIAGNOSIS — L308 Other specified dermatitis: Secondary | ICD-10-CM | POA: Diagnosis not present

## 2023-02-10 DIAGNOSIS — Z85828 Personal history of other malignant neoplasm of skin: Secondary | ICD-10-CM | POA: Diagnosis not present

## 2023-02-10 DIAGNOSIS — D1801 Hemangioma of skin and subcutaneous tissue: Secondary | ICD-10-CM | POA: Diagnosis not present

## 2023-02-10 DIAGNOSIS — L821 Other seborrheic keratosis: Secondary | ICD-10-CM | POA: Diagnosis not present

## 2023-02-10 DIAGNOSIS — L820 Inflamed seborrheic keratosis: Secondary | ICD-10-CM | POA: Diagnosis not present

## 2023-02-10 DIAGNOSIS — L570 Actinic keratosis: Secondary | ICD-10-CM | POA: Diagnosis not present

## 2023-02-16 ENCOUNTER — Ambulatory Visit: Payer: Medicare PPO | Admitting: Internal Medicine

## 2023-02-16 ENCOUNTER — Encounter (INDEPENDENT_AMBULATORY_CARE_PROVIDER_SITE_OTHER): Payer: Self-pay | Admitting: Otolaryngology

## 2023-02-16 ENCOUNTER — Encounter: Payer: Self-pay | Admitting: Internal Medicine

## 2023-02-16 VITALS — BP 140/82 | HR 92 | Temp 98.2°F | Ht 69.0 in | Wt 195.8 lb

## 2023-02-16 DIAGNOSIS — H6123 Impacted cerumen, bilateral: Secondary | ICD-10-CM

## 2023-02-16 DIAGNOSIS — J301 Allergic rhinitis due to pollen: Secondary | ICD-10-CM | POA: Diagnosis not present

## 2023-02-16 DIAGNOSIS — Z0001 Encounter for general adult medical examination with abnormal findings: Secondary | ICD-10-CM | POA: Diagnosis not present

## 2023-02-16 DIAGNOSIS — N1831 Chronic kidney disease, stage 3a: Secondary | ICD-10-CM

## 2023-02-16 DIAGNOSIS — Z23 Encounter for immunization: Secondary | ICD-10-CM

## 2023-02-16 DIAGNOSIS — I10 Essential (primary) hypertension: Secondary | ICD-10-CM | POA: Diagnosis not present

## 2023-02-16 DIAGNOSIS — E790 Hyperuricemia without signs of inflammatory arthritis and tophaceous disease: Secondary | ICD-10-CM

## 2023-02-16 LAB — BASIC METABOLIC PANEL
BUN: 17 mg/dL (ref 6–23)
CO2: 27 meq/L (ref 19–32)
Calcium: 9.9 mg/dL (ref 8.4–10.5)
Chloride: 98 meq/L (ref 96–112)
Creatinine, Ser: 1.23 mg/dL (ref 0.40–1.50)
GFR: 54.7 mL/min — ABNORMAL LOW (ref >60.00)
Glucose, Bld: 102 mg/dL — ABNORMAL HIGH (ref 70–99)
Potassium: 4.3 meq/L (ref 3.5–5.1)
Sodium: 134 meq/L — ABNORMAL LOW (ref 135–145)

## 2023-02-16 LAB — CBC WITH DIFFERENTIAL/PLATELET
Basophils Absolute: 0 10*3/uL (ref 0.0–0.1)
Basophils Relative: 0.3 % (ref 0.0–3.0)
Eosinophils Absolute: 0.1 10*3/uL (ref 0.0–0.7)
Eosinophils Relative: 1.9 % (ref 0.0–5.0)
HCT: 45.2 % (ref 39.0–52.0)
Hemoglobin: 15.3 g/dL (ref 13.0–17.0)
Lymphocytes Relative: 25.2 % (ref 12.0–46.0)
Lymphs Abs: 1.7 10*3/uL (ref 0.7–4.0)
MCHC: 33.7 g/dL (ref 30.0–36.0)
MCV: 95.5 fL (ref 78.0–100.0)
Monocytes Absolute: 0.7 10*3/uL (ref 0.1–1.0)
Monocytes Relative: 9.8 % (ref 3.0–12.0)
Neutro Abs: 4.3 10*3/uL (ref 1.4–7.7)
Neutrophils Relative %: 62.8 % (ref 43.0–77.0)
Platelets: 241 10*3/uL (ref 150.0–400.0)
RBC: 4.74 Mil/uL (ref 4.22–5.81)
RDW: 13.1 % (ref 11.5–15.5)
WBC: 6.9 10*3/uL (ref 4.0–10.5)

## 2023-02-16 LAB — URIC ACID: Uric Acid, Serum: 7.2 mg/dL (ref 4.0–7.8)

## 2023-02-16 MED ORDER — LEVOCETIRIZINE DIHYDROCHLORIDE 5 MG PO TABS
5.0000 mg | ORAL_TABLET | Freq: Every evening | ORAL | 1 refills | Status: AC
Start: 2023-02-16 — End: ?

## 2023-02-16 NOTE — Patient Instructions (Signed)
Health Maintenance, Male Adopting a healthy lifestyle and getting preventive care are important in promoting health and wellness. Ask your health care provider about: The right schedule for you to have regular tests and exams. Things you can do on your own to prevent diseases and keep yourself healthy. What should I know about diet, weight, and exercise? Eat a healthy diet  Eat a diet that includes plenty of vegetables, fruits, low-fat dairy products, and lean protein. Do not eat a lot of foods that are high in solid fats, added sugars, or sodium. Maintain a healthy weight Body mass index (BMI) is a measurement that can be used to identify possible weight problems. It estimates body fat based on height and weight. Your health care provider can help determine your BMI and help you achieve or maintain a healthy weight. Get regular exercise Get regular exercise. This is one of the most important things you can do for your health. Most adults should: Exercise for at least 150 minutes each week. The exercise should increase your heart rate and make you sweat (moderate-intensity exercise). Do strengthening exercises at least twice a week. This is in addition to the moderate-intensity exercise. Spend less time sitting. Even light physical activity can be beneficial. Watch cholesterol and blood lipids Have your blood tested for lipids and cholesterol at 82 years of age, then have this test every 5 years. You may need to have your cholesterol levels checked more often if: Your lipid or cholesterol levels are high. You are older than 82 years of age. You are at high risk for heart disease. What should I know about cancer screening? Many types of cancers can be detected early and may often be prevented. Depending on your health history and family history, you may need to have cancer screening at various ages. This may include screening for: Colorectal cancer. Prostate cancer. Skin cancer. Lung  cancer. What should I know about heart disease, diabetes, and high blood pressure? Blood pressure and heart disease High blood pressure causes heart disease and increases the risk of stroke. This is more likely to develop in people who have high blood pressure readings or are overweight. Talk with your health care provider about your target blood pressure readings. Have your blood pressure checked: Every 3-5 years if you are 18-39 years of age. Every year if you are 40 years old or older. If you are between the ages of 65 and 75 and are a current or former smoker, ask your health care provider if you should have a one-time screening for abdominal aortic aneurysm (AAA). Diabetes Have regular diabetes screenings. This checks your fasting blood sugar level. Have the screening done: Once every three years after age 45 if you are at a normal weight and have a low risk for diabetes. More often and at a younger age if you are overweight or have a high risk for diabetes. What should I know about preventing infection? Hepatitis B If you have a higher risk for hepatitis B, you should be screened for this virus. Talk with your health care provider to find out if you are at risk for hepatitis B infection. Hepatitis C Blood testing is recommended for: Everyone born from 1945 through 1965. Anyone with known risk factors for hepatitis C. Sexually transmitted infections (STIs) You should be screened each year for STIs, including gonorrhea and chlamydia, if: You are sexually active and are younger than 82 years of age. You are older than 82 years of age and your   health care provider tells you that you are at risk for this type of infection. Your sexual activity has changed since you were last screened, and you are at increased risk for chlamydia or gonorrhea. Ask your health care provider if you are at risk. Ask your health care provider about whether you are at high risk for HIV. Your health care provider  may recommend a prescription medicine to help prevent HIV infection. If you choose to take medicine to prevent HIV, you should first get tested for HIV. You should then be tested every 3 months for as long as you are taking the medicine. Follow these instructions at home: Alcohol use Do not drink alcohol if your health care provider tells you not to drink. If you drink alcohol: Limit how much you have to 0-2 drinks a day. Know how much alcohol is in your drink. In the [place] one drink equals one 12 oz bottle of beer (355 mL), one 5 oz glass of wine (148 mL), or one 1 oz glass of hard liquor (44 mL). Lifestyle Do not use any products that contain nicotine or tobacco. These products include cigarettes, chewing tobacco, and vaping devices, such as e-cigarettes. If you need help quitting, ask your health care provider. Do not use street drugs. Do not share needles. Ask your health care provider for help if you need support or information about quitting drugs. General instructions Schedule regular health, dental, and eye exams. Stay current with your vaccines. Tell your health care provider if: You often feel depressed. You have ever been abused or do not feel safe at home. Summary Adopting a healthy lifestyle and getting preventive care are important in promoting health and wellness. Follow your health care provider's instructions about healthy diet, exercising, and getting tested or screened for diseases. Follow your health care provider's instructions on monitoring your cholesterol and blood pressure. This information is not intended to replace advice given to you by your health care provider. Make sure you discuss any questions you have with your health care provider. Document Revised: 07/14/2020 Document Reviewed: 07/14/2020 Elsevier Patient Education  2024 [employer]  

## 2023-02-16 NOTE — Progress Notes (Signed)
Subjective:  Patient ID: Jeffrey Watkins, male    DOB: August 24, 1940  Age: 82 y.o. MRN: [id number]  CC: Annual Exam, Hypertension, Hyperlipidemia, and Allergic Rhinitis    HPI MIRAN KOHOUT presents for a CPX and f/up ---  Discussed the use of AI scribe software for clinical note transcription with the patient, who gave verbal consent to proceed.  History of Present Illness   The patient, with a history of allergies, presents with increased nasal symptoms during the fall season, particularly when blowing leaves. He has been using Flonase, an over-the-counter steroid nasal spray, and Xyzal, an antihistamine, both of which were previously prescribed for similar symptoms. He also uses a neti pot for symptomatic relief. The patient reports no sinus pain or infection, but notes a dryness in the nose and occasional pink-tinged nasal discharge.  In addition to his allergy symptoms, the patient also mentions a need for regular ear cleaning due to wax build-up. He is currently on a regimen of 80mg  Edarbi for hypertension, which appears to be well-controlled.  The patient has a history of shingles and chickenpox but has not received a shingles vaccine. He expresses reluctance to receive the vaccine, despite having had shingles in the past.       Outpatient Medications Prior to Visit  Medication Sig Dispense Refill   b complex vitamins tablet Take 1 tablet by mouth daily.     Calcium-Magnesium-Vitamin D (CALCIUM MAGNESIUM PO) Take 2 tablets by mouth daily.     co-enzyme Q-10 30 MG capsule Take 100 mg by mouth 2 (two) times daily.     EDARBI 80 MG TABS TAKE 1 TABLET BY MOUTH EVERY DAY 90 tablet 1   fluorouracil (EFUDEX) 5 % cream APPLY DAILY TO FOREHEAD AND SCALP AS DIRECTED  0   fluticasone (FLONASE) 50 MCG/ACT nasal spray Place 2 sprays into both nostrils daily. 48 g 1   GRAPE SEED EXTRACT PO Take by mouth.     L-ARGININE PO Take by mouth.     Misc Natural Products (PROSTATE HEALTH PO) Take by  mouth.     Multiple Vitamin (MULTIVITAMIN) tablet Take 1 tablet by mouth daily.     niacin 500 MG tablet Take 500 mg by mouth in the morning and at bedtime.     Omega-3 Fatty Acids (FISH OIL ULTRA PO) Take by mouth.     Probiotic Product (PROBIOTIC PO) Take by mouth. Daily     Resveratrol 100 MG CAPS Take 2 capsules by mouth daily.     rosuvastatin (CRESTOR) 5 MG tablet TAKE 1 TABLET (5 MG TOTAL) BY MOUTH DAILY. 90 tablet 0   triamcinolone cream (KENALOG) 0.1 % SMARTSIG:1 Application Topical 2-3 Times Daily     Vitamin D-Vitamin K (VITAMIN K2-VITAMIN D3) 45-2000 MCG-UNIT CAPS Take by mouth.     Emollient (VITAMIN E WITH PANTHENOL EX) Apply topically.     levocetirizine (XYZAL) 5 MG tablet TAKE 1 TABLET BY MOUTH EVERY DAY IN THE EVENING (Patient not taking: Reported on 02/16/2023) 90 tablet 1   OVER THE COUNTER MEDICATION daily. OTC Amyloid Armour three caps daily     Potassium 99 MG TABS Take by mouth daily.     No facility-administered medications prior to visit.    ROS Review of Systems  Constitutional:  Negative for appetite change, chills, diaphoresis, fatigue and fever.  HENT:  Positive for congestion, hearing loss, nosebleeds, postnasal drip and rhinorrhea. Negative for ear discharge, ear pain, sinus pressure, sinus pain, sore throat, tinnitus  and voice change.   Eyes:  Negative for visual disturbance.  Respiratory: Negative.  Negative for cough, chest tightness, shortness of breath and wheezing.   Cardiovascular: Negative.  Negative for chest pain, palpitations and leg swelling.  Gastrointestinal: Negative.  Negative for abdominal pain, constipation, diarrhea, nausea and vomiting.  Endocrine: Negative.   Genitourinary: Negative.  Negative for difficulty urinating.  Musculoskeletal: Negative.  Negative for arthralgias and myalgias.  Skin: Negative.  Negative for color change and rash.  Neurological: Negative.  Negative for dizziness, weakness and headaches.  Hematological:   Negative for adenopathy. Does not bruise/bleed easily.  Psychiatric/Behavioral:  Positive for confusion and decreased concentration.     Objective:  BP (!) 140/82 (BP Location: Left Arm, Patient Position: Sitting, Cuff Size: Normal)   Pulse 92   Temp 98.2 F (36.8 C) (Oral)   Ht 5\' 9"  (1.753 m)   Wt 195 lb 12.8 oz (88.8 kg)   SpO2 92%   BMI 28.91 kg/m   BP Readings from Last 3 Encounters:  02/16/23 (!) 140/82  08/24/22 136/88  02/17/22 (!) 142/82    Wt Readings from Last 3 Encounters:  02/16/23 195 lb 12.8 oz (88.8 kg)  08/24/22 192 lb (87.1 kg)  02/17/22 194 lb (88 kg)    Physical Exam Vitals reviewed.  Constitutional:      Appearance: Normal appearance.  HENT:     Right Ear: Decreased hearing noted. There is impacted cerumen.     Left Ear: Decreased hearing noted. There is impacted cerumen.     Nose: Congestion and rhinorrhea present. No nasal tenderness or mucosal edema. Rhinorrhea is clear.     Right Nostril: No epistaxis.     Left Nostril: No epistaxis.     Right Sinus: No maxillary sinus tenderness or frontal sinus tenderness.     Left Sinus: No maxillary sinus tenderness or frontal sinus tenderness.     Mouth/Throat:     Mouth: Mucous membranes are moist.  Eyes:     General: No scleral icterus.    Conjunctiva/sclera: Conjunctivae normal.  Cardiovascular:     Rate and Rhythm: Normal rate and regular rhythm.     Heart sounds: Murmur heard.     Systolic murmur is present with a grade of 3/6.     No diastolic murmur is present.     No friction rub. No gallop.     Comments: 3/6 SEM RUSB Pulmonary:     Effort: Pulmonary effort is normal.     Breath sounds: No stridor. No wheezing, rhonchi or rales.  Abdominal:     General: Abdomen is flat.     Palpations: There is no mass.     Tenderness: There is no abdominal tenderness. There is no guarding.     Hernia: No hernia is present.  Musculoskeletal:     Cervical back: Neck supple.     Right lower leg: No  edema.     Left lower leg: No edema.  Skin:    General: Skin is warm and dry.  Neurological:     General: No focal deficit present.     Mental Status: He is alert.  Psychiatric:        Mood and Affect: Mood normal.        Behavior: Behavior normal.     Lab Results  Component Value Date   WBC 6.9 02/16/2023   HGB 15.3 02/16/2023   HCT 45.2 02/16/2023   PLT 241.0 02/16/2023   GLUCOSE 102 (H) 02/16/2023  CHOL 205 (H) 08/24/2022   TRIG 85.0 08/24/2022   HDL 61.50 08/24/2022   LDLDIRECT 138.8 04/17/2012   LDLCALC 126 (H) 08/24/2022   ALT 19 08/24/2022   AST 24 08/24/2022   NA 134 (L) 02/16/2023   K 4.3 02/16/2023   CL 98 02/16/2023   CREATININE 1.23 02/16/2023   BUN 17 02/16/2023   CO2 27 02/16/2023   TSH 2.50 08/26/2021   PSA 2.34 08/24/2022   HGBA1C 5.8 08/26/2021   MICROALBUR 0.7 02/22/2006    MR Abdomen W Wo Contrast  Result Date: 02/10/2017 CLINICAL DATA:  Followup indeterminate left renal cystic lesion. EXAM: MRI ABDOMEN WITHOUT AND WITH CONTRAST TECHNIQUE: Multiplanar multisequence MR imaging of the abdomen was performed both before and after the administration of intravenous contrast. CONTRAST:  20mL MULTIHANCE GADOBENATE DIMEGLUMINE 529 MG/ML IV SOLN COMPARISON:  02/03/2016 and 07/10/2015 FINDINGS: Lower chest: No acute findings. Hepatobiliary: Stable 1.6 cm benign hemangioma in the right hepatic lobe. No other liver masses identified. Gallbladder is unremarkable. No evidence of biliary duct dilatation. Pancreas: No mass or inflammatory changes. Pancreas divisum again noted. No evidence of pancreatic ductal dilatation. Stable tiny scattered less than 5 mm cystic foci within the pancreas, consistent with benign etiology. Spleen:  Within normal limits in size and appearance. Adrenals/Urinary Tract: Stable benign simple right renal cysts. A 1.7 cm subcapsular lesion in the anterior lower pole of the left kidney remains stable compared to previous studies. This lesion shows  T1 and T2 hyperintensity and no evidence of contrast enhancement on subtraction imaging, consistent with benign Bosniak category 2 hemorrhagic cyst. A few tiny sub-cm left renal cysts also noted. No evidence of hydronephrosis. Stomach/Bowel: Visualized portions within abdomen are unremarkable. Vascular/Lymphatic: No pathologically enlarged lymph nodes identified. No abdominal aortic aneurysm. Other:  None. Musculoskeletal:  No suspicious bone lesions identified. 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 Other simple renal cysts are also stable. No evidence of renal neoplasm. Stable small benign hepatic hemangioma. Electronically Signed   By: Myles Rosenthal M.D.   On: 02/10/2017 10:22    Assessment & Plan:  Essential hypertension- His BP is well controlled. -     CBC with Differential/Platelet; Future -     Basic metabolic panel; Future  Hyperuricemia -     Uric acid; Future -     Basic metabolic panel; Future  Need for immunization against influenza -     Flu Vaccine Trivalent High Dose (Fluad)  Seasonal allergic rhinitis due to pollen -     Levocetirizine Dihydrochloride; Take 1 tablet (5 mg total) by mouth every evening.  Dispense: 90 tablet; Refill: 1  Encounter for general adult medical examination with abnormal findings- Exam completed, labs reviewed, vaccines reviewed and updated, no cancer screenings indicated, pt ed material was given.   Bilateral hearing loss due to cerumen impaction -     Ambulatory referral to ENT  Stage 3a chronic kidney disease (HCC)- Renal function is stable.     Follow-up: Return in about 6 months (around 08/17/2023).  Sanda Linger, MD

## 2023-04-12 DIAGNOSIS — H6123 Impacted cerumen, bilateral: Secondary | ICD-10-CM | POA: Insufficient documentation

## 2023-05-02 DIAGNOSIS — H2513 Age-related nuclear cataract, bilateral: Secondary | ICD-10-CM | POA: Diagnosis not present

## 2023-05-02 DIAGNOSIS — H524 Presbyopia: Secondary | ICD-10-CM | POA: Diagnosis not present

## 2023-05-25 ENCOUNTER — Other Ambulatory Visit: Payer: Self-pay | Admitting: Internal Medicine

## 2023-05-25 DIAGNOSIS — I10 Essential (primary) hypertension: Secondary | ICD-10-CM

## 2023-08-11 DIAGNOSIS — Z85828 Personal history of other malignant neoplasm of skin: Secondary | ICD-10-CM | POA: Diagnosis not present

## 2023-08-11 DIAGNOSIS — L738 Other specified follicular disorders: Secondary | ICD-10-CM | POA: Diagnosis not present

## 2023-08-11 DIAGNOSIS — L570 Actinic keratosis: Secondary | ICD-10-CM | POA: Diagnosis not present

## 2023-08-11 DIAGNOSIS — L821 Other seborrheic keratosis: Secondary | ICD-10-CM | POA: Diagnosis not present

## 2023-08-11 DIAGNOSIS — D485 Neoplasm of uncertain behavior of skin: Secondary | ICD-10-CM | POA: Diagnosis not present

## 2023-08-11 DIAGNOSIS — D1801 Hemangioma of skin and subcutaneous tissue: Secondary | ICD-10-CM | POA: Diagnosis not present

## 2023-08-11 DIAGNOSIS — C44329 Squamous cell carcinoma of skin of other parts of face: Secondary | ICD-10-CM | POA: Diagnosis not present

## 2023-08-23 ENCOUNTER — Encounter: Payer: Self-pay | Admitting: Internal Medicine

## 2023-08-23 ENCOUNTER — Ambulatory Visit: Admitting: Internal Medicine

## 2023-08-23 VITALS — BP 142/86 | HR 87 | Temp 98.2°F | Resp 16 | Ht 69.0 in | Wt 195.0 lb

## 2023-08-23 DIAGNOSIS — N401 Enlarged prostate with lower urinary tract symptoms: Secondary | ICD-10-CM | POA: Diagnosis not present

## 2023-08-23 DIAGNOSIS — E785 Hyperlipidemia, unspecified: Secondary | ICD-10-CM

## 2023-08-23 DIAGNOSIS — R739 Hyperglycemia, unspecified: Secondary | ICD-10-CM | POA: Diagnosis not present

## 2023-08-23 DIAGNOSIS — R351 Nocturia: Secondary | ICD-10-CM

## 2023-08-23 DIAGNOSIS — R011 Cardiac murmur, unspecified: Secondary | ICD-10-CM | POA: Diagnosis not present

## 2023-08-23 DIAGNOSIS — E790 Hyperuricemia without signs of inflammatory arthritis and tophaceous disease: Secondary | ICD-10-CM

## 2023-08-23 DIAGNOSIS — I10 Essential (primary) hypertension: Secondary | ICD-10-CM | POA: Diagnosis not present

## 2023-08-23 DIAGNOSIS — N1831 Chronic kidney disease, stage 3a: Secondary | ICD-10-CM

## 2023-08-23 LAB — LIPID PANEL
Cholesterol: 223 mg/dL — ABNORMAL HIGH (ref 0–200)
HDL: 51.5 mg/dL (ref >39.00)
LDL Cholesterol: 152 mg/dL — ABNORMAL HIGH (ref 0–99)
NonHDL: 171.2
Total CHOL/HDL Ratio: 4
Triglycerides: 95 mg/dL (ref 0.0–149.0)
VLDL: 19 mg/dL (ref 0.0–40.0)

## 2023-08-23 LAB — HEPATIC FUNCTION PANEL
ALT: 14 U/L (ref 0–53)
AST: 18 U/L (ref 0–37)
Albumin: 4.4 g/dL (ref 3.5–5.2)
Alkaline Phosphatase: 46 U/L (ref 39–117)
Bilirubin, Direct: 0.1 mg/dL (ref 0.0–0.3)
Total Bilirubin: 0.6 mg/dL (ref 0.2–1.2)
Total Protein: 6.4 g/dL (ref 6.0–8.3)

## 2023-08-23 LAB — BASIC METABOLIC PANEL WITH GFR
BUN: 12 mg/dL (ref 6–23)
CO2: 30 meq/L (ref 19–32)
Calcium: 9.8 mg/dL (ref 8.4–10.5)
Chloride: 97 meq/L (ref 96–112)
Creatinine, Ser: 1.11 mg/dL (ref 0.40–1.50)
GFR: 61.65 mL/min (ref >60.00)
Glucose, Bld: 99 mg/dL (ref 70–99)
Potassium: 4.5 meq/L (ref 3.5–5.1)
Sodium: 133 meq/L — ABNORMAL LOW (ref 135–145)

## 2023-08-23 LAB — CBC WITH DIFFERENTIAL/PLATELET
Basophils Absolute: 0 10*3/uL (ref 0.0–0.1)
Basophils Relative: 0.2 % (ref 0.0–3.0)
Eosinophils Absolute: 0.2 10*3/uL (ref 0.0–0.7)
Eosinophils Relative: 2.5 % (ref 0.0–5.0)
HCT: 41.7 % (ref 39.0–52.0)
Hemoglobin: 14.2 g/dL (ref 13.0–17.0)
Lymphocytes Relative: 26 % (ref 12.0–46.0)
Lymphs Abs: 1.8 10*3/uL (ref 0.7–4.0)
MCHC: 34 g/dL (ref 30.0–36.0)
MCV: 93.3 fl (ref 78.0–100.0)
Monocytes Absolute: 0.7 10*3/uL (ref 0.1–1.0)
Monocytes Relative: 10.4 % (ref 3.0–12.0)
Neutro Abs: 4.1 10*3/uL (ref 1.4–7.7)
Neutrophils Relative %: 60.9 % (ref 43.0–77.0)
Platelets: 224 10*3/uL (ref 150.0–400.0)
RBC: 4.47 Mil/uL (ref 4.22–5.81)
RDW: 13.3 % (ref 11.5–15.5)
WBC: 6.8 10*3/uL (ref 4.0–10.5)

## 2023-08-23 LAB — URINALYSIS, ROUTINE W REFLEX MICROSCOPIC
Bilirubin Urine: NEGATIVE
Ketones, ur: NEGATIVE
Leukocytes,Ua: NEGATIVE
Nitrite: NEGATIVE
Specific Gravity, Urine: 1.01 (ref 1.000–1.030)
Total Protein, Urine: NEGATIVE
Urine Glucose: NEGATIVE
Urobilinogen, UA: 0.2 (ref 0.0–1.0)
pH: 6 (ref 5.0–8.0)

## 2023-08-23 LAB — PSA: PSA: 2.68 ng/mL (ref 0.10–4.00)

## 2023-08-23 LAB — CK: Total CK: 186 U/L (ref 7–232)

## 2023-08-23 LAB — URIC ACID: Uric Acid, Serum: 6.4 mg/dL (ref 4.0–7.8)

## 2023-08-23 LAB — HEMOGLOBIN A1C: Hgb A1c MFr Bld: 5.9 % (ref 4.6–6.5)

## 2023-08-23 LAB — TSH: TSH: 2.41 u[IU]/mL (ref 0.35–5.50)

## 2023-08-23 NOTE — Patient Instructions (Signed)
 Hypertension, Adult High blood pressure (hypertension) is when the force of blood pumping through the arteries is too strong. The arteries are the blood vessels that carry blood from the heart throughout the body. Hypertension forces the heart to work harder to pump blood and may cause arteries to become narrow or stiff. Untreated or uncontrolled hypertension can lead to a heart attack, heart failure, a stroke, kidney disease, and other problems. A blood pressure reading consists of a higher number over a lower number. Ideally, your blood pressure should be below 120/80. The first ("top") number is called the systolic pressure. It is a measure of the pressure in your arteries as your heart beats. The second ("bottom") number is called the diastolic pressure. It is a measure of the pressure in your arteries as the heart relaxes. What are the causes? The exact cause of this condition is not known. There are some conditions that result in high blood pressure. What increases the risk? Certain factors may make you more likely to develop high blood pressure. Some of these risk factors are under your control, including: Smoking. Not getting enough exercise or physical activity. Being overweight. Having too much fat, sugar, calories, or salt (sodium) in your diet. Drinking too much alcohol. Other risk factors include: Having a personal history of heart disease, diabetes, high cholesterol, or kidney disease. Stress. Having a family history of high blood pressure and high cholesterol. Having obstructive sleep apnea. Age. The risk increases with age. What are the signs or symptoms? High blood pressure may not cause symptoms. Very high blood pressure (hypertensive crisis) may cause: Headache. Fast or irregular heartbeats (palpitations). Shortness of breath. Nosebleed. Nausea and vomiting. Vision changes. Severe chest pain, dizziness, and seizures. How is this diagnosed? This condition is diagnosed by  measuring your blood pressure while you are seated, with your arm resting on a flat surface, your legs uncrossed, and your feet flat on the floor. The cuff of the blood pressure monitor will be placed directly against the skin of your upper arm at the level of your heart. Blood pressure should be measured at least twice using the same arm. Certain conditions can cause a difference in blood pressure between your right and left arms. If you have a high blood pressure reading during one visit or you have normal blood pressure with other risk factors, you may be asked to: Return on a different day to have your blood pressure checked again. Monitor your blood pressure at home for 1 week or longer. If you are diagnosed with hypertension, you may have other blood or imaging tests to help your health care provider understand your overall risk for other conditions. How is this treated? This condition is treated by making healthy lifestyle changes, such as eating healthy foods, exercising more, and reducing your alcohol intake. You may be referred for counseling on a healthy diet and physical activity. Your health care provider may prescribe medicine if lifestyle changes are not enough to get your blood pressure under control and if: Your systolic blood pressure is above 130. Your diastolic blood pressure is above 80. Your personal target blood pressure may vary depending on your medical conditions, your age, and other factors. Follow these instructions at home: Eating and drinking  Eat a diet that is high in fiber and potassium, and low in sodium, added sugar, and fat. An example of this eating plan is called the DASH diet. DASH stands for Dietary Approaches to Stop Hypertension. To eat this way: Eat  plenty of fresh fruits and vegetables. Try to fill one half of your plate at each meal with fruits and vegetables. Eat whole grains, such as whole-wheat pasta, brown rice, or whole-grain bread. Fill about one  fourth of your plate with whole grains. Eat or drink low-fat dairy products, such as skim milk or low-fat yogurt. Avoid fatty cuts of meat, processed or cured meats, and poultry with skin. Fill about one fourth of your plate with lean proteins, such as fish, chicken without skin, beans, eggs, or tofu. Avoid pre-made and processed foods. These tend to be higher in sodium, added sugar, and fat. Reduce your daily sodium intake. Many people with hypertension should eat less than 1,500 mg of sodium a day. Do not drink alcohol if: Your health care provider tells you not to drink. You are pregnant, may be pregnant, or are planning to become pregnant. If you drink alcohol: Limit how much you have to: 0-1 drink a day for women. 0-2 drinks a day for men. Know how much alcohol is in your drink. In the [place] one drink equals one 12 oz bottle of beer (355 mL), one 5 oz glass of wine (148 mL), or one 1 oz glass of hard liquor (44 mL). Lifestyle  Work with your health care provider to maintain a healthy body weight or to lose weight. Ask what an ideal weight is for you. Get at least 30 minutes of exercise that causes your heart to beat faster (aerobic exercise) most days of the week. Activities may include walking, swimming, or biking. Include exercise to strengthen your muscles (resistance exercise), such as Pilates or lifting weights, as part of your weekly exercise routine. Try to do these types of exercises for 30 minutes at least 3 days a week. Do not use any products that contain nicotine or tobacco. These products include cigarettes, chewing tobacco, and vaping devices, such as e-cigarettes. If you need help quitting, ask your health care provider. Monitor your blood pressure at home as told by your health care provider. Keep all follow-up visits. This is important. Medicines Take over-the-counter and prescription medicines only as told by your health care provider. Follow directions carefully. Blood  pressure medicines must be taken as prescribed. Do not skip doses of blood pressure medicine. Doing this puts you at risk for problems and can make the medicine less effective. Ask your health care provider about side effects or reactions to medicines that you should watch for. Contact a health care provider if you: Think you are having a reaction to a medicine you are taking. Have headaches that keep coming back (recurring). Feel dizzy. Have swelling in your ankles. Have trouble with your vision. Get help right away if you: Develop a severe headache or confusion. Have unusual weakness or numbness. Feel faint. Have severe pain in your chest or abdomen. Vomit repeatedly. Have trouble breathing. These symptoms may be an emergency. Get help right away. Call 911. Do not wait to see if the symptoms will go away. Do not drive yourself to the hospital. Summary Hypertension is when the force of blood pumping through your arteries is too strong. If this condition is not controlled, it may put you at risk for serious complications. Your personal target blood pressure may vary depending on your medical conditions, your age, and other factors. For most people, a normal blood pressure is less than 120/80. Hypertension is treated with lifestyle changes, medicines, or a combination of both. Lifestyle changes include losing weight, eating a healthy,  low-sodium diet, exercising more, and limiting alcohol. This information is not intended to replace advice given to you by your health care provider. Make sure you discuss any questions you have with your health care provider. Document Revised: 12/30/2020 Document Reviewed: 12/30/2020 Elsevier Patient Education  2024 [employer].

## 2023-08-23 NOTE — Progress Notes (Unsigned)
 Subjective:  Patient ID: Jeffrey Watkins, male    DOB: 03/23/1940  Age: 83 y.o. MRN: [id number]  CC: Hypertension and Hyperlipidemia   HPI Jeffrey Watkins presents for f/up ----  Discussed the use of AI scribe software for clinical note transcription with the patient, who gave verbal consent to proceed.  History of Present Illness   Jeffrey Watkins is an 83 year old male who presents for a follow-up visit.  He recently underwent a procedure by an ENT specialist to remove wax from his ear, which has improved his symptoms. He recalls this procedure took place around January or February.  No chest pain, shortness of breath, dizziness, lightheadedness, or swelling in his legs or feet during physical activity. His blood pressure tends to be elevated when visiting the doctor's office but is typically around 140s at home.  He stopped taking a statin medication about a year ago due to muscle aches. He was taking two 5 mg pills twice a week before discontinuing it.  He is scheduled for MOSE surgery next week, which will be his eighth procedure on his face.       Outpatient Medications Prior to Visit  Medication Sig Dispense Refill   b complex vitamins tablet Take 1 tablet by mouth daily.     Calcium -Magnesium-Vitamin D  (CALCIUM  MAGNESIUM PO) Take 2 tablets by mouth daily.     co-enzyme Q-10 30 MG capsule Take 100 mg by mouth 2 (two) times daily.     EDARBI  80 MG TABS TAKE 1 TABLET BY MOUTH EVERY DAY 90 tablet 1   fluorouracil (EFUDEX) 5 % cream APPLY DAILY TO FOREHEAD AND SCALP AS DIRECTED  0   fluticasone  (FLONASE ) 50 MCG/ACT nasal spray Place 2 sprays into both nostrils daily. 48 g 1   GRAPE SEED EXTRACT PO Take by mouth.     L-ARGININE PO Take by mouth.     levocetirizine (XYZAL ) 5 MG tablet Take 1 tablet (5 mg total) by mouth every evening. 90 tablet 1   Misc Natural Products (PROSTATE HEALTH PO) Take by mouth.     Multiple Vitamin (MULTIVITAMIN) tablet Take 1 tablet by mouth daily.      niacin 500 MG tablet Take 500 mg by mouth in the morning and at bedtime.     Omega-3 Fatty Acids (FISH OIL ULTRA PO) Take by mouth.     Probiotic Product (PROBIOTIC PO) Take by mouth. Daily     Resveratrol 100 MG CAPS Take 2 capsules by mouth daily.     triamcinolone cream (KENALOG) 0.1 % SMARTSIG:1 Application Topical 2-3 Times Daily     Vitamin D -Vitamin K (VITAMIN K2-VITAMIN D3) 45-2000 MCG-UNIT CAPS Take by mouth.     rosuvastatin  (CRESTOR ) 5 MG tablet TAKE 1 TABLET (5 MG TOTAL) BY MOUTH DAILY. 90 tablet 0   No facility-administered medications prior to visit.    ROS Review of Systems  Objective:  BP (!) 142/86 (BP Location: Left Arm, Patient Position: Sitting, Cuff Size: Normal)   Pulse 87   Temp 98.2 F (36.8 C) (Oral)   Resp 16   Ht 5' 9 (1.753 m)   Wt 195 lb (88.5 kg)   SpO2 94%   BMI 28.80 kg/m   BP Readings from Last 3 Encounters:  08/23/23 (!) 142/86  02/16/23 (!) 140/82  08/24/22 136/88    Wt Readings from Last 3 Encounters:  08/23/23 195 lb (88.5 kg)  02/16/23 195 lb 12.8 oz (88.8 kg)  08/24/22 192 lb (  87.1 kg)    Physical Exam Vitals reviewed.  Constitutional:      Appearance: Normal appearance.  HENT:     Nose: Nose normal.     Mouth/Throat:     Mouth: Mucous membranes are moist.   Eyes:     General: No scleral icterus.    Conjunctiva/sclera: Conjunctivae normal.    Cardiovascular:     Rate and Rhythm: Normal rate and regular rhythm.     Heart sounds: Murmur heard.     Systolic murmur is present with a grade of 3/6.     No friction rub. No gallop.     Comments: EKG-- NSR, 67 bpm LAD - new No LVH, Q waves, or ST/T wave changes  Pulmonary:     Effort: Pulmonary effort is normal.     Breath sounds: No stridor. No wheezing, rhonchi or rales.  Abdominal:     General: Abdomen is flat.     Palpations: There is no mass.     Tenderness: There is no abdominal tenderness. There is no guarding.     Hernia: No hernia is present.    Musculoskeletal:        General: Normal range of motion.     Cervical back: Neck supple.     Right lower leg: No edema.     Left lower leg: No edema.  Lymphadenopathy:     Cervical: No cervical adenopathy.   Skin:    General: Skin is warm and dry.   Neurological:     General: No focal deficit present.     Mental Status: He is alert. Mental status is at baseline.   Psychiatric:        Behavior: Behavior normal.     Lab Results  Component Value Date   WBC 6.8 08/23/2023   HGB 14.2 08/23/2023   HCT 41.7 08/23/2023   PLT 224.0 08/23/2023   GLUCOSE 99 08/23/2023   CHOL 223 (H) 08/23/2023   TRIG 95.0 08/23/2023   HDL 51.50 08/23/2023   LDLDIRECT 138.8 04/17/2012   LDLCALC 152 (H) 08/23/2023   ALT 14 08/23/2023   AST 18 08/23/2023   NA 133 (L) 08/23/2023   K 4.5 08/23/2023   CL 97 08/23/2023   CREATININE 1.11 08/23/2023   BUN 12 08/23/2023   CO2 30 08/23/2023   TSH 2.41 08/23/2023   PSA 2.68 08/23/2023   HGBA1C 5.9 08/23/2023   MICROALBUR 0.7 02/22/2006    MR Abdomen W Wo Contrast Result Date: 02/10/2017 CLINICAL DATA:  Followup indeterminate left renal cystic lesion. EXAM: MRI ABDOMEN WITHOUT AND WITH CONTRAST TECHNIQUE: Multiplanar multisequence MR imaging of the abdomen was performed both before and after the administration of intravenous contrast. CONTRAST:  20mL MULTIHANCE  GADOBENATE DIMEGLUMINE  529 MG/ML IV SOLN COMPARISON:  02/03/2016 and 07/10/2015 FINDINGS: Lower chest: No acute findings. Hepatobiliary: Stable 1.6 cm benign hemangioma in the right hepatic lobe. No other liver masses identified. Gallbladder is unremarkable. No evidence of biliary duct dilatation. Pancreas: No mass or inflammatory changes. Pancreas divisum again noted. No evidence of pancreatic ductal dilatation. Stable tiny scattered less than 5 mm cystic foci within the pancreas, consistent with benign etiology. Spleen:  Within normal limits in size and appearance. Adrenals/Urinary Tract: Stable  benign simple right renal cysts. A 1.7 cm subcapsular lesion in the anterior lower pole of the left kidney remains stable compared to previous studies. This lesion shows T1 and T2 hyperintensity and no evidence of contrast enhancement on subtraction imaging, consistent with benign Bosniak category  2 hemorrhagic cyst. A few tiny sub-cm left renal cysts also noted. No evidence of hydronephrosis. Stomach/Bowel: Visualized portions within abdomen are unremarkable. Vascular/Lymphatic: No pathologically enlarged lymph nodes identified. No abdominal aortic aneurysm. Other:  None. Musculoskeletal:  No suspicious bone lesions identified. 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 Other simple renal cysts are also stable. No evidence of renal neoplasm. Stable small benign hepatic hemangioma. Electronically Signed   By: Nicanor Barge M.D.   On: 02/10/2017 10:22    Assessment & Plan:  Stage 3a chronic kidney disease (HCC) -     Urinalysis, Routine w reflex microscopic; Future -     Basic metabolic panel with GFR; Future  Hyperlipidemia with target LDL less than 130 -     Lipid panel; Future -     TSH; Future -     Hepatic function panel; Future -     CK; Future  BPH associated with nocturia -     PSA; Future  Hyperglycemia -     Hemoglobin A1c; Future -     Basic metabolic panel with GFR; Future  Hyperuricemia -     Uric acid; Future  Essential hypertension -     TSH; Future -     Urinalysis, Routine w reflex microscopic; Future -     CBC with Differential/Platelet; Future -     EKG 12-Lead  Heart murmur -     ECHOCARDIOGRAM COMPLETE; Future     Follow-up: Return in about 6 months (around 02/22/2024).  Sandra Crouch, MD

## 2023-08-24 ENCOUNTER — Other Ambulatory Visit: Payer: Self-pay | Admitting: Internal Medicine

## 2023-08-24 ENCOUNTER — Ambulatory Visit: Payer: Self-pay | Admitting: Internal Medicine

## 2023-08-24 DIAGNOSIS — J301 Allergic rhinitis due to pollen: Secondary | ICD-10-CM

## 2023-08-30 DIAGNOSIS — C44329 Squamous cell carcinoma of skin of other parts of face: Secondary | ICD-10-CM | POA: Diagnosis not present

## 2023-08-30 DIAGNOSIS — Z85828 Personal history of other malignant neoplasm of skin: Secondary | ICD-10-CM | POA: Diagnosis not present

## 2023-11-25 ENCOUNTER — Other Ambulatory Visit: Payer: Self-pay | Admitting: Internal Medicine

## 2023-11-25 DIAGNOSIS — I10 Essential (primary) hypertension: Secondary | ICD-10-CM

## 2023-12-01 ENCOUNTER — Ambulatory Visit (HOSPITAL_COMMUNITY)
Admission: RE | Admit: 2023-12-01 | Discharge: 2023-12-01 | Disposition: A | Discharge status: Home / Self Care | Source: Ambulatory Visit | Attending: Vascular Surgery | Admitting: Vascular Surgery

## 2023-12-01 ENCOUNTER — Other Ambulatory Visit: Payer: Self-pay | Admitting: Internal Medicine

## 2023-12-01 DIAGNOSIS — R011 Cardiac murmur, unspecified: Secondary | ICD-10-CM | POA: Diagnosis not present

## 2023-12-01 DIAGNOSIS — I10 Essential (primary) hypertension: Secondary | ICD-10-CM

## 2023-12-01 LAB — ECHOCARDIOGRAM COMPLETE
AR max vel: 1.04 cm2
AV Area VTI: 1.09 cm2
AV Area mean vel: 1.02 cm2
AV Mean grad: 13.8 mmHg
AV Peak grad: 21.9 mmHg
Ao pk vel: 2.34 m/s
Area-P 1/2: 1.85 cm2
MV VTI: 1.57 cm2
S' Lateral: 2.99 cm

## 2023-12-01 NOTE — Telephone Encounter (Signed)
 Copied from CRM [id number]. Topic: Clinical - Medication Refill >> Dec 01, 2023 12:23 PM Shereese L wrote: Medication: EDARBI  80 MG TABS  Has the patient contacted their pharmacy? Yes (Agent: If no, request that the patient contact the pharmacy for the refill. If patient does not wish to contact the pharmacy document the reason why and proceed with request.) (Agent: If yes, when and what did the pharmacy advise?)  This is the patient's preferred pharmacy:  [id number] [address] [place], [place] - [address] [place] [address] Phone: [phone] Fax: [phone]   Is this the correct pharmacy for this prescription? Yes If no, delete pharmacy and type the correct one.   Has the prescription been filled recently? Yes  Is the patient out of the medication? Yes  Has the patient been seen for an appointment in the last year OR does the patient have an upcoming appointment? Yes  Can we respond through MyChart? Yes  Agent: Please be advised that Rx refills may take up to 3 business days. We ask that you follow-up with your pharmacy.

## 2023-12-01 NOTE — Telephone Encounter (Signed)
 Med refill

## 2023-12-05 NOTE — Telephone Encounter (Unsigned)
 Copied from CRM [id number]. Topic: Clinical - Prescription Issue >> Dec 05, 2023  1:04 PM Timindy P wrote: Reason for CRM: Braden, patients wife is calling after speaking with pharmacy regarding the patients EDARBI . They are not receiving the RX through fax and are asking that an RN call in the RX as the patient is almost out of medication. They provided Maxine with the number [phone] for the RN to call in the RX.

## 2024-02-22 ENCOUNTER — Encounter: Payer: Self-pay | Admitting: Internal Medicine

## 2024-02-22 ENCOUNTER — Ambulatory Visit: Admitting: Internal Medicine

## 2024-02-22 ENCOUNTER — Ambulatory Visit: Payer: Self-pay | Admitting: Internal Medicine

## 2024-02-22 VITALS — BP 142/88 | HR 85 | Temp 98.5°F | Resp 16 | Ht 69.0 in | Wt 193.2 lb

## 2024-02-22 DIAGNOSIS — Z0000 Encounter for general adult medical examination without abnormal findings: Secondary | ICD-10-CM | POA: Diagnosis not present

## 2024-02-22 DIAGNOSIS — I10 Essential (primary) hypertension: Secondary | ICD-10-CM | POA: Diagnosis not present

## 2024-02-22 DIAGNOSIS — E790 Hyperuricemia without signs of inflammatory arthritis and tophaceous disease: Secondary | ICD-10-CM | POA: Diagnosis not present

## 2024-02-22 DIAGNOSIS — I350 Nonrheumatic aortic (valve) stenosis: Secondary | ICD-10-CM | POA: Insufficient documentation

## 2024-02-22 DIAGNOSIS — N1831 Chronic kidney disease, stage 3a: Secondary | ICD-10-CM | POA: Diagnosis not present

## 2024-02-22 DIAGNOSIS — Z0001 Encounter for general adult medical examination with abnormal findings: Secondary | ICD-10-CM

## 2024-02-22 DIAGNOSIS — Z23 Encounter for immunization: Secondary | ICD-10-CM | POA: Diagnosis not present

## 2024-02-22 LAB — CBC WITH DIFFERENTIAL/PLATELET
Basophils Absolute: 0 K/uL (ref 0.0–0.1)
Basophils Relative: 0.4 % (ref 0.0–3.0)
Eosinophils Absolute: 0.1 K/uL (ref 0.0–0.7)
Eosinophils Relative: 2.3 % (ref 0.0–5.0)
HCT: 42.3 % (ref 39.0–52.0)
Hemoglobin: 14.4 g/dL (ref 13.0–17.0)
Lymphocytes Relative: 25.1 % (ref 12.0–46.0)
Lymphs Abs: 1.7 K/uL (ref 0.7–4.0)
MCHC: 34.1 g/dL (ref 30.0–36.0)
MCV: 93.9 fl (ref 78.0–100.0)
Monocytes Absolute: 0.7 K/uL (ref 0.1–1.0)
Monocytes Relative: 11.3 % (ref 3.0–12.0)
Neutro Abs: 4 K/uL (ref 1.4–7.7)
Neutrophils Relative %: 60.9 % (ref 43.0–77.0)
Platelets: 227 K/uL (ref 150.0–400.0)
RBC: 4.51 Mil/uL (ref 4.22–5.81)
RDW: 13.3 % (ref 11.5–15.5)
WBC: 6.6 K/uL (ref 4.0–10.5)

## 2024-02-22 LAB — URIC ACID: Uric Acid, Serum: 6.2 mg/dL (ref 4.0–7.8)

## 2024-02-22 LAB — BASIC METABOLIC PANEL WITH GFR
BUN: 14 mg/dL (ref 6–23)
CO2: 29 meq/L (ref 19–32)
Calcium: 10 mg/dL (ref 8.4–10.5)
Chloride: 98 meq/L (ref 96–112)
Creatinine, Ser: 1.21 mg/dL (ref 0.40–1.50)
GFR: 55.39 mL/min — ABNORMAL LOW (ref >60.00)
Glucose, Bld: 109 mg/dL — ABNORMAL HIGH (ref 70–99)
Potassium: 4.7 meq/L (ref 3.5–5.1)
Sodium: 134 meq/L — ABNORMAL LOW (ref 135–145)

## 2024-02-22 MED ORDER — EDARBI 80 MG PO TABS: 1.0000 | ORAL_TABLET | Freq: Every day | ORAL | 1 refills | Status: AC

## 2024-02-22 NOTE — Patient Instructions (Signed)
 Aortic Valve Stenosis  Aortic valve stenosis is a narrowing of the aortic valve in the heart. The aortic valve opens and closes to regulate blood flow between the left side of the heart (left ventricle) and the artery that leads away from the heart (aorta). When the aortic valve becomes narrow, it is difficult for the heart to pump blood out to the body, which causes the heart to work harder. The extra work can weaken the heart muscle over time. Aortic valve stenosis can range from mild to severe. If it is not treated, it can become more severe over time and lead to heart failure. What are the causes? This condition may be caused by: Buildup of calcium around and on the aortic valve. This can occur with aging. This is the most common cause of aortic valve stenosis. A heart problem that developed in the womb (birth defect). Rheumatic fever. Radiation to the chest. What increases the risk? You are more likely to develop this condition if: You are 65 years and older. You were born with an abnormal bicuspid valve. What are the signs or symptoms? You may not have any symptoms until your condition becomes severe. It may take 10-20 years for mild or moderate aortic valve stenosis to become severe. Symptoms may include: Shortness of breath. This may get worse during physical activity. Feeling unusually weak and tired (fatigue). Extreme discomfort in the chest, neck, or arm during physical activity (angina). A heartbeat that is irregular or faster than normal (palpitations). Dizziness or fainting. This may happen when you get tired or after you take certain heart medicines, such as nitroglycerin. How is this diagnosed? This condition may be diagnosed with: A physical exam. Echocardiogram. This is a type of imaging test that uses sound waves (ultrasound) to make images of your heart. There are two kinds of this test that may be used. Transthoracic echocardiogram (TTE). For this type, a wand-like tool  (transducer) is moved over your chest to create ultrasound images that are recorded by a computer. Transesophageal echocardiogram (TEE). For this type, a flexible tube (probe) is inserted down the part of the body that moves food from your mouth to your stomach (esophagus). The heart and the esophagus are close to each other. Your health care provider will use the probe to take clear, detailed pictures of the heart. Cardiac catheterization. For this procedure, a small, thin tube (catheter) is passed through a large vein in your neck, groin, or arm. The catheter is used to get information about arteries, structures, blood pressure, and oxygen levels in your heart. Exercise stress tests. These are tests that check the blood supply to your heart and your heart's response to exercise. These tests help determine the severity of your condition. Cardiac magnetic resonance imaging (MRI). This test uses magnetic fields and radio waves to create detailed images of your heart. It is used to look at the size of your aorta. Computed tomography, or CT scan. This is a test that uses a series of X-rays and a computer to produce a 3D image of the heart. This test may be used to measure the size of your aorta and look at your aortic valve more closely. You may work with a health care provider who specializes in the heart (cardiologist) for diagnosis and treatment. How is this treated? Treatment depends on how severe your condition is and what your symptoms are. You will need to have your heart checked regularly to make sure that your condition is not getting  worse or causing serious problems. Treatment may include: Surgery to replace your aortic valve. This is the most common treatment for aortic valve stenosis, and it is the only treatment to cure the condition. Several types of surgeries are available. The surgery may be done: Through a large incision over your heart (open-heart surgery). Through small incisions, using a  flexible tube called a catheter (transcatheter aortic valve replacement, TAVR). Medicines that help to keep your heart rate regular. Medicines that thin your blood (anticoagulants) to prevent blood clots. Antibiotic medicines to help prevent infection. If your condition is mild, you may only need regular follow-up visits for monitoring. Follow these instructions at home: Lifestyle If you drink alcohol: Limit how much you have to: 0-1 drink a day for women. 0-2 drinks a day for men. Know how much alcohol is in your drink. In the [place] one drink equals one 12 oz bottle of beer (355 mL), one 5 oz glass of wine (148 mL), or one 1 oz glass of hard liquor (44 mL). Do not use any products that contain nicotine or tobacco. These products include cigarettes, chewing tobacco, and vaping devices, such as e-cigarettes. If you need help quitting, ask your health care provider. Work with your health care provider to manage your blood pressure and cholesterol. Maintain a healthy weight. Eating and drinking Follow instructions from your health care provider about eating or drinking restrictions. You may be told to: Eat a heart-healthy diet that includes plenty of fresh fruits and vegetables, whole grains, lean protein, and low-fat or nonfat dairy. Limit how much caffeine you drink. Caffeine can affect your heart's rate and rhythm. Avoid certain foods, including: Foods that are high in salt (sodium), saturated fat, or sugar. Canned or highly processed food. Fried foods.  Activity Exercise regularly. If your aortic valve stenosis is mild, you may only need to avoid very intense physical activity, such as heavy weight lifting. The more severe your aortic valve stenosis is, the more activities you may need to avoid. Return to your normal activities as told by your health care provider. Ask your health care provider what amount and type of physical activity is safe for you. If you are taking blood  thinners: Talk with your health care provider before you take any medicines that contain aspirin or NSAIDs, such as ibuprofen. These medicines increase your risk for dangerous bleeding. Take your medicine exactly as told, at the same time every day. Avoid activities that could cause injury or bruising. Follow instructions about how to prevent falls. Wear a medical alert bracelet or carry a card that lists what medicines you take. General instructions Take over-the-counter and prescription medicines only as told by your health care provider. If you were prescribed an antibiotic, take it as told by your health care provider. Do not stop taking the antibiotic even if you start to feel better. If you are a woman and you plan to become pregnant, talk with your health care provider before you become pregnant. Before you have any type of medical or dental procedure or surgery, tell all health care providers that you have aortic valve stenosis. This may affect treatment that you receive. Keep all follow-up visits. This is important. Contact a health care provider if: You have a fever. Get help right away if: You develop any of the following symptoms: Chest pain. Chest tightness. Shortness of breath. You feel light-headed. You feel like you might faint. Your heartbeat is irregular or faster than normal. These symptoms may be  an emergency. Get help right away. Call 911. Do not wait to see if the symptoms will go away. Do not drive yourself to the hospital. Summary Aortic valve stenosis is a narrowing of the aortic valve in the heart. The aortic valve regulates blood flow between the left ventricle and the aorta. If it is not treated, aortic valve stenosis can lead to heart failure. Treatment depends on how severe your condition is and what your symptoms are. You will need to have your heart checked regularly to make sure that your condition is not getting worse or causing serious problems. Exercise  regularly. Ask your health care provider what amount and type of physical activity is safe for you. This information is not intended to replace advice given to you by your health care provider. Make sure you discuss any questions you have with your health care provider. Document Revised: 11/23/2020 Document Reviewed: 11/23/2020 Elsevier Patient Education  2024 [employer].

## 2024-02-22 NOTE — Progress Notes (Unsigned)
 Subjective:  Patient ID: Jeffrey Watkins, male    DOB: [birth year]  Age: 83 y.o. MRN: [id number]  CC: Hypertension (6 month follow up ) and Annual Exam   HPI Jeffrey Watkins presents for a CPX and f/up ----  Discussed the use of AI scribe software for clinical note transcription with the patient, who gave verbal consent to proceed.  History of Present Illness Jeffrey Watkins is an 83 year old male who presents for follow-up regarding his heart murmur and recent echocardiogram results.  He underwent an echocardiogram a couple of months ago to evaluate his heart murmur, with a previous one conducted five years ago. He finds the echocardiogram report difficult to understand due to the numerous measurements included.  He maintains an active lifestyle, including walking a mile a day and working in the yard. No symptoms of chest pain, shortness of breath, cold sweats, dizziness, lightheadedness, or irregular heartbeat are noted.    Outpatient Medications Prior to Visit  Medication Sig Dispense Refill   b complex vitamins tablet Take 1 tablet by mouth daily.     Calcium -Magnesium-Vitamin D  (CALCIUM  MAGNESIUM PO) Take 2 tablets by mouth daily.     co-enzyme Q-10 30 MG capsule Take 100 mg by mouth 2 (two) times daily.     fluorouracil (EFUDEX) 5 % cream APPLY DAILY TO FOREHEAD AND SCALP AS DIRECTED  0   fluticasone  (FLONASE ) 50 MCG/ACT nasal spray Place 2 sprays into both nostrils daily. 48 g 1   GRAPE SEED EXTRACT PO Take by mouth.     L-ARGININE PO Take by mouth.     levocetirizine (XYZAL ) 5 MG tablet TAKE 1 TABLET BY MOUTH EVERY DAY IN THE EVENING 90 tablet 1   Misc Natural Products (PROSTATE HEALTH PO) Take by mouth.     Multiple Vitamin (MULTIVITAMIN) tablet Take 1 tablet by mouth daily.     niacin 500 MG tablet Take 500 mg by mouth in the morning and at bedtime.     Omega-3 Fatty Acids (FISH OIL ULTRA PO) Take by mouth.     Probiotic Product (PROBIOTIC PO) Take by mouth. Daily      Resveratrol 100 MG CAPS Take 2 capsules by mouth daily.     triamcinolone cream (KENALOG) 0.1 % SMARTSIG:1 Application Topical 2-3 Times Daily     Vitamin D -Vitamin K (VITAMIN K2-VITAMIN D3) 45-2000 MCG-UNIT CAPS Take by mouth.     EDARBI  80 MG TABS TAKE 1 TABLET BY MOUTH EVERY DAY 90 tablet 1   No facility-administered medications prior to visit.    ROS Review of Systems  Constitutional: Negative.  Negative for appetite change, chills, diaphoresis, fatigue and fever.  HENT: Negative.  Negative for trouble swallowing.   Eyes: Negative.  Negative for visual disturbance.  Respiratory: Negative.  Negative for cough, chest tightness, shortness of breath and wheezing.   Cardiovascular:  Negative for chest pain, palpitations and leg swelling.  Gastrointestinal: Negative.  Negative for abdominal pain, constipation, diarrhea, nausea and vomiting.  Endocrine: Negative.   Genitourinary: Negative.  Negative for difficulty urinating.  Musculoskeletal: Negative.  Negative for arthralgias, joint swelling and myalgias.  Skin: Negative.   Neurological:  Negative for dizziness, syncope, weakness and light-headedness.  Hematological:  Negative for adenopathy. Does not bruise/bleed easily.  Psychiatric/Behavioral: Negative.      Objective:  BP (!) 142/88 (BP Location: Left Arm, Patient Position: Sitting, Cuff Size: Normal)   Pulse 85   Temp 98.5 F (36.9 C) (Temporal)   Resp  16   Ht 5' 9 (1.753 m)   Wt 193 lb 3.2 oz (87.6 kg)   SpO2 95%   BMI 28.53 kg/m   BP Readings from Last 3 Encounters:  02/22/24 (!) 142/88  08/23/23 (!) 142/86  02/16/23 (!) 140/82    Wt Readings from Last 3 Encounters:  02/22/24 193 lb 3.2 oz (87.6 kg)  08/23/23 195 lb (88.5 kg)  02/16/23 195 lb 12.8 oz (88.8 kg)    Physical Exam Vitals reviewed.  Constitutional:      Appearance: Normal appearance.  HENT:     Nose: Nose normal.     Mouth/Throat:     Mouth: Mucous membranes are moist.  Eyes:     General:  No scleral icterus.    Conjunctiva/sclera: Conjunctivae normal.  Cardiovascular:     Rate and Rhythm: Normal rate and regular rhythm.     Heart sounds: Murmur heard.     Systolic murmur is present with a grade of 3/6.     No diastolic murmur is present.     No friction rub. No gallop.  Pulmonary:     Effort: Pulmonary effort is normal.     Breath sounds: No stridor. No wheezing, rhonchi or rales.  Abdominal:     General: Abdomen is flat.     Palpations: There is no mass.     Tenderness: There is no abdominal tenderness. There is no guarding.     Hernia: No hernia is present.  Musculoskeletal:        General: Normal range of motion.     Cervical back: Neck supple.     Right lower leg: No edema.     Left lower leg: No edema.  Lymphadenopathy:     Cervical: No cervical adenopathy.  Skin:    General: Skin is warm and dry.  Neurological:     General: No focal deficit present.     Mental Status: He is alert. Mental status is at baseline.  Psychiatric:        Mood and Affect: Mood normal.        Behavior: Behavior normal.     Lab Results  Component Value Date   WBC 6.6 02/22/2024   HGB 14.4 02/22/2024   HCT 42.3 02/22/2024   PLT 227.0 02/22/2024   GLUCOSE 109 (H) 02/22/2024   CHOL 223 (H) 08/23/2023   TRIG 95.0 08/23/2023   HDL 51.50 08/23/2023   LDLDIRECT 138.8 04/17/2012   LDLCALC 152 (H) 08/23/2023   ALT 14 08/23/2023   AST 18 08/23/2023   NA 134 (L) 02/22/2024   K 4.7 02/22/2024   CL 98 02/22/2024   CREATININE 1.21 02/22/2024   BUN 14 02/22/2024   CO2 29 02/22/2024   TSH 2.41 08/23/2023   PSA 2.68 08/23/2023   HGBA1C 5.9 08/23/2023   MICROALBUR 0.7 02/22/2006    ECHOCARDIOGRAM COMPLETE Result Date: 12/01/2023    ECHOCARDIOGRAM REPORT   Patient Name:   Jeffrey Watkins  Date of Exam: 12/01/2023 Medical Rec #:  [id number]       Height:       69.0 in Accession #:    [id number]      Weight:       195.0 lb Date of Birth:  1940/07/06        BSA:          2.044 m Patient  Age:    83 years        BP:  142/86 mmHg Patient Gender: M               HR:           76 bpm. Exam Location:  [address] Procedure: 2D Echo, 3D Echo, Cardiac Doppler and Color Doppler (Both Spectral            and Color Flow Doppler were utilized during procedure). 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 Risk Factors:Hypertension, Dyslipidemia, CKD                 stage 3a and Non-Smoker. Patient denies chest pain, SOB and leg                 edema. Patient has been told he has a murmur since childhood. He                 denies rheumatic fever but indicates he had whooping cough as an                 infant.  Sonographer:    Annabella Cater RVT, RDCS (AE), RDMS Referring Phys: [id number] DEBBY LITTIE MOLT  Sonographer Comments: Global longitudinal strain was attempted. IMPRESSIONS  1. Left ventricular ejection fraction, by estimation, is 60 to 65%. The left ventricle has normal function. The left ventricle has no regional wall motion abnormalities. There is severe left ventricular hypertrophy. Left ventricular diastolic parameters  are consistent with Grade I diastolic dysfunction (impaired relaxation). Elevated left atrial pressure.  2. Right ventricular systolic function is normal. The right ventricular size is normal.  3. Left atrial size was mildly dilated.  4. The mitral valve is normal in structure. Trivial mitral valve regurgitation. No evidence of mitral stenosis.  5. The aortic valve is calcified. Aortic valve regurgitation is not visualized. Moderate aortic valve stenosis.  6. Aortic dilatation noted. There is mild dilatation of the ascending aorta, measuring 43 mm.  7. The inferior vena cava is normal in size with greater than 50% respiratory variability, suggesting right atrial pressure of 3 mmHg. Comparison(s): EF 60%, AOR 37mm, asc AOR 43 mm. FINDINGS  Left Ventricle: Left  ventricular ejection fraction, by estimation, is 60 to 65%. The left ventricle has normal function. The left ventricle has no regional wall motion abnormalities. Strain was performed and the global longitudinal strain is indeterminate. The left ventricular internal cavity size was normal in size. There is severe left ventricular hypertrophy. Left ventricular diastolic parameters are consistent with Grade I diastolic dysfunction (impaired relaxation). Elevated left atrial  pressure. Right Ventricle: The right ventricular size is normal. Right ventricular systolic function is normal. Left Atrium: Left atrial size was mildly dilated. Right Atrium: Right atrial size was normal in size. Pericardium: Trivial pericardial effusion is present. Mitral Valve: The mitral valve is normal in structure. Mild mitral annular calcification. Trivial mitral valve regurgitation. No evidence of mitral valve stenosis. MV peak gradient, 7.6 mmHg. The mean mitral valve gradient is 3.0 mmHg. Tricuspid Valve: The tricuspid valve is normal in structure. Tricuspid valve regurgitation is trivial. No evidence of tricuspid stenosis. Aortic Valve: The aortic valve is calcified. Aortic valve regurgitation is not visualized. Moderate aortic stenosis is present. Aortic valve mean gradient measures 13.8 mmHg. Aortic valve peak gradient measures 21.9 mmHg. Aortic valve area, by VTI measures  1.09 cm. Pulmonic Valve: The pulmonic valve was normal in structure. Pulmonic valve regurgitation is trivial. No evidence of pulmonic stenosis. Aorta: Aortic dilatation noted. There is mild dilatation of the ascending aorta, measuring 43 mm. Venous: The inferior vena cava is normal in size with greater than 50% respiratory variability, suggesting right atrial pressure of 3 mmHg. IAS/Shunts: No atrial level shunt detected by color flow Doppler. Additional Comments: 3D was performed not requiring image post processing on an independent workstation and was normal.   LEFT VENTRICLE PLAX 2D LVIDd:         4.58 cm   Diastology LVIDs:         2.99 cm   LV e' medial:    3.30 cm/s LV PW:         1.30 cm   LV E/e' medial:  33.3 LV IVS:        1.60 cm   LV e' lateral:   4.95 cm/s LVOT diam:     1.68 cm   LV E/e' lateral: 22.2 LV SV:         53 LV SV Index:   26 LVOT Area:     2.22 cm LV IVRT:       109 msec                          3D Volume EF:                          3D EF:        65 %                          LV EDV:       105 ml                          LV ESV:       37 ml                          LV SV:        68 ml RIGHT VENTRICLE RV S prime:     16.40 cm/s TAPSE (M-mode): 2.6 cm LEFT ATRIUM              Index        RIGHT ATRIUM           Index LA diam:        4.08 cm  2.00 cm/m   RA Area:     16.90 cm LA Vol (A2C):   103.0 ml 50.39 ml/m  RA Volume:   40.90 ml  20.01 ml/m LA Vol (A4C):   52.0 ml  25.44 ml/m LA Biplane Vol: 79.4 ml  38.85 ml/m  AORTIC VALVE                     PULMONIC VALVE AV Area (Vmax):    1.04 cm      PV Vmax:       1.16 m/s AV Area (Vmean):   1.02 cm      PV Peak grad:  5.4 mmHg AV Area (VTI):     1.09 cm AV Vmax:           234.00 cm/s AV Vmean:          177.000 cm/s AV VTI:  0.488 m AV Peak Grad:      21.9 mmHg AV Mean Grad:      13.8 mmHg LVOT Vmax:         110.00 cm/s LVOT Vmean:        81.400 cm/s LVOT VTI:          0.239 m LVOT/AV VTI ratio: 0.49  AORTA Ao Root diam: 3.72 cm Ao Asc diam:  4.41 cm Ao Arch diam: 3.7 cm MITRAL VALVE MV Area (PHT): 1.85 cm     SHUNTS MV Area VTI:   1.57 cm     Systemic VTI:  0.24 m MV Peak grad:  7.6 mmHg     Systemic Diam: 1.68 cm MV Mean grad:  3.0 mmHg MV Vmax:       1.38 m/s MV Vmean:      87.7 cm/s MV Decel Time: 409 msec MV E velocity: 110.00 cm/s MV A velocity: 148.00 cm/s MV E/A ratio:  0.74 Redell Shallow MD Electronically signed by Redell Shallow MD Signature Date/Time: 12/01/2023/4:01:37 PM    Final    Estimated Creatinine Clearance: 50.7 mL/min (by C-G formula based on SCr of 1.21  mg/dL).   Assessment & Plan:   Encounter for general adult medical examination with abnormal findings- Exam completed, labs reviewed, vaccines reviewed and updated, no cancer screenings indicated, pt ed material was given.   Essential hypertension- BP is well controlled. -     Basic metabolic panel with GFR; Future -     CBC with Differential/Platelet; Future -     Edarbi ; Take 1 tablet (80 mg total) by mouth daily.  Dispense: 90 tablet; Refill: 1  Need for immunization against influenza -     Flu vaccine HIGH DOSE PF(Fluzone Trivalent)  Hyperuricemia -     Uric acid; Future  Nonrheumatic aortic valve stenosis- No symptoms.  Stage 3a chronic kidney disease (HCC)- Will avoid nephrotoxic agents      Follow-up: Return in about 6 months (around 08/22/2024).  Debby Molt, MD
